# Patient Record
Sex: Female | Born: 1990 | Race: Black or African American | Hispanic: No | Marital: Single | Smoking: Never smoker
Health system: Southern US, Community
[De-identification: ages and names within clinical notes are randomized; demographics above are authoritative.]

## PROBLEM LIST (undated history)

## (undated) DIAGNOSIS — R519 Headache, unspecified: Secondary | ICD-10-CM

## (undated) DIAGNOSIS — IMO0002 Reserved for concepts with insufficient information to code with codable children: Secondary | ICD-10-CM

## (undated) DIAGNOSIS — E039 Hypothyroidism, unspecified: Secondary | ICD-10-CM

## (undated) DIAGNOSIS — R569 Unspecified convulsions: Secondary | ICD-10-CM

## (undated) DIAGNOSIS — M329 Systemic lupus erythematosus, unspecified: Secondary | ICD-10-CM

## (undated) DIAGNOSIS — G43909 Migraine, unspecified, not intractable, without status migrainosus: Secondary | ICD-10-CM

## (undated) DIAGNOSIS — R51 Headache: Secondary | ICD-10-CM

---
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
## 2012-01-31 ENCOUNTER — Encounter (HOSPITAL_BASED_OUTPATIENT_CLINIC_OR_DEPARTMENT_OTHER): Payer: Self-pay | Admitting: *Deleted

## 2012-01-31 ENCOUNTER — Emergency Department (HOSPITAL_BASED_OUTPATIENT_CLINIC_OR_DEPARTMENT_OTHER): Payer: BC Managed Care – PPO

## 2012-01-31 ENCOUNTER — Emergency Department (HOSPITAL_BASED_OUTPATIENT_CLINIC_OR_DEPARTMENT_OTHER)
Admission: EM | Admit: 2012-01-31 | Discharge: 2012-01-31 | Disposition: A | Discharge status: Home / Self Care | Payer: BC Managed Care – PPO | Attending: Emergency Medicine | Admitting: Emergency Medicine

## 2012-01-31 DIAGNOSIS — S0093XA Contusion of unspecified part of head, initial encounter: Secondary | ICD-10-CM

## 2012-01-31 DIAGNOSIS — R569 Unspecified convulsions: Secondary | ICD-10-CM

## 2012-01-31 DIAGNOSIS — R259 Unspecified abnormal involuntary movements: Secondary | ICD-10-CM | POA: Insufficient documentation

## 2012-01-31 DIAGNOSIS — D649 Anemia, unspecified: Secondary | ICD-10-CM | POA: Insufficient documentation

## 2012-01-31 HISTORY — DX: Headache, unspecified: R51.9

## 2012-01-31 HISTORY — DX: Headache: R51

## 2012-01-31 LAB — BASIC METABOLIC PANEL
BUN: 15 mg/dL (ref 6–23)
CO2: 25 mEq/L (ref 19–32)
Calcium: 9.4 mg/dL (ref 8.4–10.5)
Chloride: 105 mEq/L (ref 96–112)
Creatinine, Ser: 0.9 mg/dL (ref 0.50–1.10)
GFR calc Af Amer: >90 mL/min (ref >90)
GFR calc non Af Amer: >90 mL/min (ref >90)
Glucose, Bld: 95 mg/dL (ref 70–99)
Potassium: 3.8 mEq/L (ref 3.5–5.1)
Sodium: 139 mEq/L (ref 135–145)

## 2012-01-31 LAB — URINALYSIS, ROUTINE W REFLEX MICROSCOPIC
Bilirubin Urine: NEGATIVE
Glucose, UA: NEGATIVE mg/dL
Hgb urine dipstick: NEGATIVE
Ketones, ur: NEGATIVE mg/dL
Leukocytes, UA: NEGATIVE
Nitrite: NEGATIVE
Protein, ur: NEGATIVE mg/dL
Specific Gravity, Urine: 1.029 (ref 1.005–1.030)
Urobilinogen, UA: 1 mg/dL (ref 0.0–1.0)
pH: 6 (ref 5.0–8.0)

## 2012-01-31 LAB — CBC WITH DIFFERENTIAL/PLATELET
Basophils Absolute: 0 10*3/uL (ref 0.0–0.1)
Basophils Relative: 0 % (ref 0–1)
Eosinophils Absolute: 0.1 10*3/uL (ref 0.0–0.7)
Eosinophils Relative: 1 % (ref 0–5)
HCT: 35.8 % — ABNORMAL LOW (ref 36.0–46.0)
Hemoglobin: 11.6 g/dL — ABNORMAL LOW (ref 12.0–15.0)
Lymphocytes Relative: 34 % (ref 12–46)
Lymphs Abs: 3.1 10*3/uL (ref 0.7–4.0)
MCH: 25.8 pg — ABNORMAL LOW (ref 26.0–34.0)
MCHC: 32.4 g/dL (ref 30.0–36.0)
MCV: 79.7 fL (ref 78.0–100.0)
Monocytes Absolute: 0.8 10*3/uL (ref 0.1–1.0)
Monocytes Relative: 9 % (ref 3–12)
Neutro Abs: 5.2 10*3/uL (ref 1.7–7.7)
Neutrophils Relative %: 56 % (ref 43–77)
Platelets: 270 10*3/uL (ref 150–400)
RBC: 4.49 MIL/uL (ref 3.87–5.11)
RDW: 14.3 % (ref 11.5–15.5)
WBC: 9.2 10*3/uL (ref 4.0–10.5)

## 2012-01-31 LAB — PREGNANCY, URINE: Preg Test, Ur: NEGATIVE

## 2012-01-31 NOTE — ED Provider Notes (Signed)
Medical screening examination/treatment/procedure(s) were performed by non-physician practitioner and as supervising physician I was immediately available for consultation/collaboration.   Celene Kras, MD 01/31/12 [phone]
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
## 2012-01-31 NOTE — ED Notes (Signed)
Walked pt to [facility] sightly unsteady on feet.Alert/Orient x3,could not remember what happen,mom explained episode.

## 2012-01-31 NOTE — ED Notes (Signed)
Headaches for months. Tonight she had a syncopal episode followed by what mother states was a seizure. She was recently started on medications for the headaches.

## 2012-03-19 NOTE — ED Notes (Signed)
Pt showed up here in lobby requesting copy of school note from Sept. Pt provided note.

## 2013-10-24 ENCOUNTER — Other Ambulatory Visit (HOSPITAL_BASED_OUTPATIENT_CLINIC_OR_DEPARTMENT_OTHER): Payer: Self-pay | Admitting: Internal Medicine

## 2013-10-24 DIAGNOSIS — E039 Hypothyroidism, unspecified: Secondary | ICD-10-CM

## 2013-10-26 ENCOUNTER — Ambulatory Visit (HOSPITAL_BASED_OUTPATIENT_CLINIC_OR_DEPARTMENT_OTHER)
Admission: RE | Admit: 2013-10-26 | Discharge: 2013-10-26 | Disposition: A | Discharge status: Home / Self Care | Payer: BC Managed Care – PPO | Source: Ambulatory Visit | Attending: Internal Medicine | Admitting: Internal Medicine

## 2013-10-26 DIAGNOSIS — E049 Nontoxic goiter, unspecified: Secondary | ICD-10-CM | POA: Insufficient documentation

## 2013-10-26 DIAGNOSIS — E039 Hypothyroidism, unspecified: Secondary | ICD-10-CM

## 2013-11-29 ENCOUNTER — Encounter (HOSPITAL_COMMUNITY): Payer: Self-pay | Admitting: Emergency Medicine

## 2013-11-29 ENCOUNTER — Emergency Department (HOSPITAL_COMMUNITY)
Admission: EM | Admit: 2013-11-29 | Discharge: 2013-11-29 | Disposition: A | Discharge status: Home / Self Care | Payer: BC Managed Care – PPO | Attending: Emergency Medicine | Admitting: Emergency Medicine

## 2013-11-29 DIAGNOSIS — Z9119 Patient's noncompliance with other medical treatment and regimen: Secondary | ICD-10-CM | POA: Insufficient documentation

## 2013-11-29 DIAGNOSIS — G43909 Migraine, unspecified, not intractable, without status migrainosus: Secondary | ICD-10-CM

## 2013-11-29 DIAGNOSIS — G43009 Migraine without aura, not intractable, without status migrainosus: Secondary | ICD-10-CM | POA: Insufficient documentation

## 2013-11-29 DIAGNOSIS — Z79899 Other long term (current) drug therapy: Secondary | ICD-10-CM | POA: Insufficient documentation

## 2013-11-29 DIAGNOSIS — Z8669 Personal history of other diseases of the nervous system and sense organs: Secondary | ICD-10-CM | POA: Insufficient documentation

## 2013-11-29 DIAGNOSIS — G40909 Epilepsy, unspecified, not intractable, without status epilepticus: Secondary | ICD-10-CM | POA: Insufficient documentation

## 2013-11-29 DIAGNOSIS — E559 Vitamin D deficiency, unspecified: Secondary | ICD-10-CM | POA: Insufficient documentation

## 2013-11-29 DIAGNOSIS — Z91199 Patient's noncompliance with other medical treatment and regimen due to unspecified reason: Secondary | ICD-10-CM | POA: Insufficient documentation

## 2013-11-29 HISTORY — DX: Migraine, unspecified, not intractable, without status migrainosus: G43.909

## 2013-11-29 HISTORY — DX: Unspecified convulsions: R56.9

## 2013-11-29 LAB — BASIC METABOLIC PANEL
Anion gap: 17 — ABNORMAL HIGH (ref 5–15)
BUN: 10 mg/dL (ref 6–23)
CO2: 22 mEq/L (ref 19–32)
Calcium: 9.3 mg/dL (ref 8.4–10.5)
Chloride: 99 mEq/L (ref 96–112)
Creatinine, Ser: 0.62 mg/dL (ref 0.50–1.10)
GFR calc Af Amer: >90 mL/min (ref >90)
GFR calc non Af Amer: >90 mL/min (ref >90)
Glucose, Bld: 81 mg/dL (ref 70–99)
Potassium: 4.1 mEq/L (ref 3.7–5.3)
Sodium: 138 mEq/L (ref 137–147)

## 2013-11-29 LAB — CBC
HCT: 39.8 % (ref 36.0–46.0)
Hemoglobin: 12.7 g/dL (ref 12.0–15.0)
MCH: 23.6 pg — ABNORMAL LOW (ref 26.0–34.0)
MCHC: 31.9 g/dL (ref 30.0–36.0)
MCV: 73.8 fL — ABNORMAL LOW (ref 78.0–100.0)
Platelets: 331 10*3/uL (ref 150–400)
RBC: 5.39 MIL/uL — ABNORMAL HIGH (ref 3.87–5.11)
RDW: 16.2 % — ABNORMAL HIGH (ref 11.5–15.5)
WBC: 7.3 10*3/uL (ref 4.0–10.5)

## 2013-11-29 LAB — HCG, SERUM, QUALITATIVE: Preg, Serum: NEGATIVE

## 2013-11-29 MED ORDER — SODIUM CHLORIDE 0.9 % IV BOLUS (SEPSIS)
1000.0000 mL | Freq: Once | INTRAVENOUS | Status: AC
  Administered 2013-11-29: 1000 mL via INTRAVENOUS

## 2013-11-29 MED ORDER — METOCLOPRAMIDE HCL 5 MG/ML IJ SOLN
10.0000 mg | Freq: Once | INTRAMUSCULAR | Status: AC
  Administered 2013-11-29: 10 mg via INTRAVENOUS
  Filled 2013-11-29: qty 2

## 2013-11-29 MED ORDER — BUTALBITAL-APAP-CAFFEINE 50-325-40 MG PO TABS: 1.0000 | ORAL_TABLET | Freq: Four times a day (QID) | ORAL | Status: AC | PRN

## 2013-11-29 MED ORDER — DIPHENHYDRAMINE HCL 50 MG/ML IJ SOLN
25.0000 mg | Freq: Once | INTRAMUSCULAR | Status: AC
  Administered 2013-11-29: 25 mg via INTRAVENOUS
  Filled 2013-11-29: qty 1

## 2013-11-29 MED ORDER — DEXAMETHASONE SODIUM PHOSPHATE 10 MG/ML IJ SOLN
10.0000 mg | Freq: Once | INTRAMUSCULAR | Status: AC
  Administered 2013-11-29: 10 mg via INTRAVENOUS
  Filled 2013-11-29: qty 1

## 2013-11-29 NOTE — ED Notes (Signed)
Pt reports feeling dehydrated and feeling dizzy.  Has not been taking vit D deficiency medications for "months." Reports hx of seizures when not taking meds. Denies blurred vision; reports feeling the same as when she had seizure

## 2013-11-29 NOTE — ED Notes (Signed)
Unsuccessful IV attempt; second RN to attempt

## 2013-11-29 NOTE — Discharge Instructions (Signed)
.Please follow with your primary care doctor in the next 2 days for a check-up. They must obtain records for further management.   Do not hesitate to return to the Emergency Department for any new, worsening or concerning symptoms.   Migraine Headache A migraine headache is an intense, throbbing pain on one or both sides of your head. A migraine can last for 30 minutes to several hours. CAUSES  The exact cause of a migraine headache is not always known. However, a migraine may be caused when nerves in the brain become irritated and release chemicals that cause inflammation. This causes pain. Certain things may also trigger migraines, such as:  Alcohol.  Smoking.  Stress.  Menstruation.  Aged cheeses.  Foods or drinks that contain nitrates, glutamate, aspartame, or tyramine.  Lack of sleep.  Chocolate.  Caffeine.  Hunger.  Physical exertion.  Fatigue.  Medicines used to treat chest pain (nitroglycerine), birth control pills, estrogen, and some blood pressure medicines. SIGNS AND SYMPTOMS  Pain on one or both sides of your head.  Pulsating or throbbing pain.  Severe pain that prevents daily activities.  Pain that is aggravated by any physical activity.  Nausea, vomiting, or both.  Dizziness.  Pain with exposure to bright lights, loud noises, or activity.  General sensitivity to bright lights, loud noises, or smells. Before you get a migraine, you may get warning signs that a migraine is coming (aura). An aura may include:  Seeing flashing lights.  Seeing bright spots, halos, or zig-zag lines.  Having tunnel vision or blurred vision.  Having feelings of numbness or tingling.  Having trouble talking.  Having muscle weakness. DIAGNOSIS  A migraine headache is often diagnosed based on:  Symptoms.  Physical exam.  A CT scan or MRI of your head. These imaging tests cannot diagnose migraines, but they can help rule out other causes of  headaches. TREATMENT Medicines may be given for pain and nausea. Medicines can also be given to help prevent recurrent migraines.  HOME CARE INSTRUCTIONS  Only take over-the-counter or prescription medicines for pain or discomfort as directed by your health care provider. The use of long-term narcotics is not recommended.  Lie down in a dark, quiet room when you have a migraine.  Keep a journal to find out what may trigger your migraine headaches. For example, write down:  What you eat and drink.  How much sleep you get.  Any change to your diet or medicines.  Limit alcohol consumption.  Quit smoking if you smoke.  Get 7-9 hours of sleep, or as recommended by your health care provider.  Limit stress.  Keep lights dim if bright lights bother you and make your migraines worse. SEEK IMMEDIATE MEDICAL CARE IF:   Your migraine becomes severe.  You have a fever.  You have a stiff neck.  You have vision loss.  You have muscular weakness or loss of muscle control.  You start losing your balance or have trouble walking.  You feel faint or pass out.  You have severe symptoms that are different from your first symptoms. MAKE SURE YOU:   Understand these instructions.  Will watch your condition.  Will get help right away if you are not doing well or get worse. Document Released: 05/02/2005 Document Revised: 02/20/2013 Document Reviewed: 01/07/2013 [facility] Patient Information 2015 [place], [place]. This information is not intended to replace advice given to you by your health care provider. Make sure you discuss any questions you have with your health  care provider.  

## 2013-11-29 NOTE — ED Notes (Signed)
She states " i feel like im in a fog, it feels like the time i was dehydrated, i hardly ever drink any water." also she c/o migraine headache. She took sumatriptan at home with no relief

## 2013-11-29 NOTE — ED Provider Notes (Signed)
CSN: [id number]     Arrival date & time 11/29/13  1008 History   First MD Initiated Contact with Patient 11/29/13 1051     Chief Complaint  Patient presents with  . Fatigue     (Consider location/radiation/quality/duration/timing/severity/associated sxs/prior Treatment) HPI  Caitlin Dodson is a 23 y.o. female complaining of exacerbation of chronic migraine. Patient states that she has had a headache for 4 days. States it is 6/10, described as throbbing, it is bilateral frontal. This is typical for her headaches however normally they don't last this long. She's been taking sumatriptan at home with no relief. Patient [facility]. She also has a history of vitamin D deficiency she's been noncompliant with her vitamin D supplementation. She also has associated symptoms of forgetfulness and easily distractible the last 4 days. She denies fever, chills, cervicalgia, rash, thunderclap onset, photophobia, phonophobia, chest pain, shortness of breath, abdominal pain, nausea vomiting, change in bowel or bladder habits.  Past Medical History  Diagnosis Date  . Persistent headaches   . Migraine   . Seizures    History reviewed. No pertinent past surgical history. History reviewed. No pertinent family history. History  Substance Use Topics  . Smoking status: Never Smoker   . Smokeless tobacco: Not on file  . Alcohol Use: No   OB History   Grav Para Term Preterm Abortions TAB SAB Ect Mult Living                 Review of Systems  10 systems reviewed and found to be negative, except as noted in the HPI.   Allergies  Tomato  Home Medications   Prior to Admission medications   Medication Sig Start Date End Date Taking? Authorizing Provider  aspirin-acetaminophen-caffeine (EXCEDRIN MIGRAINE) [phone] MG per tablet Take 1 tablet by mouth every 6 (six) hours as needed. For headache.   Yes Historical Provider, MD  SUMAtriptan (IMITREX) 50 MG tablet Take 50 mg by mouth every 2  (two) hours as needed. For migraine.   Yes Historical Provider, MD  Vitamin D, Ergocalciferol, (DRISDOL) 50000 UNITS CAPS capsule Take 50,000 Units by mouth every 7 (seven) days.   Yes Historical Provider, MD  butalbital-acetaminophen-caffeine (FIORICET) 50-325-40 MG per tablet Take 1 tablet by mouth every 6 (six) hours as needed for headache. 11/29/13 11/29/14  Joni Reining Enos Muhl, PA-C   BP 108/68  Pulse 69  Temp(Src) 98.7 F (37.1 C) (Oral)  Resp 18  Ht 5\' 6"  (1.676 m)  Wt 230 lb (104.327 kg)  BMI 37.14 kg/m2  SpO2 100% Physical Exam  Nursing note and vitals reviewed. Constitutional: She is oriented to person, place, and time. She appears well-developed and well-nourished. No distress.  HENT:  Head: Normocephalic and atraumatic.  Mouth/Throat: Oropharynx is clear and moist.  Eyes: Conjunctivae and EOM are normal. Pupils are equal, round, and reactive to light.  Neck: Normal range of motion. Neck supple.  FROM to C-spine. Pt can touch chin to chest without discomfort. No TTP of midline cervical spine.   Cardiovascular: Normal rate, regular rhythm and intact distal pulses.   Pulmonary/Chest: Effort normal and breath sounds normal. No stridor. No respiratory distress. She has no wheezes. She has no rales. She exhibits no tenderness.  Abdominal: Soft. Bowel sounds are normal. She exhibits no distension and no mass. There is no tenderness. There is no rebound and no guarding.  Musculoskeletal: Normal range of motion.  Neurological: She is alert and oriented to person, place, and time. No cranial nerve  deficit.  Follows commands, Clear, goal oriented speech, Strength is 5 out of 5x4 extremities, patient ambulates with a coordinated in nonantalgic gait. Sensation is grossly intact.   Psychiatric: She has a normal mood and affect.    ED Course  Procedures (including critical care time) Labs Review Labs Reviewed  CBC - Abnormal; Notable for the following:    RBC 5.39 (*)    MCV 73.8 (*)     MCH 23.6 (*)    RDW 16.2 (*)    All other components within normal limits  BASIC METABOLIC PANEL - Abnormal; Notable for the following:    Anion gap 17 (*)    All other components within normal limits  HCG, SERUM, QUALITATIVE    Imaging Review No results found.   EKG Interpretation None      MDM   Final diagnoses:  Migraine without status migrainosus, not intractable, unspecified migraine type    Filed Vitals:   11/29/13 1028 11/29/13 1215 11/29/13 1300  BP: 129/80 116/72 108/68  Pulse: 91 90 69  Temp: 98.7 F (37.1 C)    TempSrc: Oral    Resp: 18 18 18   Height: 5\' 6"  (1.676 m)    Weight: 230 lb (104.327 kg)    SpO2: 97% 100% 100%    Medications  sodium chloride 0.9 % bolus 1,000 mL (0 mLs Intravenous Stopped 11/29/13 1311)  metoCLOPramide (REGLAN) injection 10 mg (10 mg Intravenous Given 11/29/13 1209)  diphenhydrAMINE (BENADRYL) injection 25 mg (25 mg Intravenous Given 11/29/13 1209)  dexamethasone (DECADRON) injection 10 mg (10 mg Intravenous Given 11/29/13 1209)    Caitlin Dodson is a 23 y.o. female presenting with exacerbation of chronic migraine, not alleviated by sumatriptan. Neuro exam is nonfocal. Headache presents typical for her, however, it has lasted  longer than normal. HA. Presentation is like pts typical HA and non concerning for [facility], ICH, Meningitis, or temporal arteritis. Pt is afebrile with no focal neuro deficits, nuchal rigidity, or change in vision. Pt is to follow up with her neurologist. Pt verbalizes understanding and is agreeable with plan to dc.  Evaluation does not show pathology that would require ongoing emergent intervention or inpatient treatment. Pt is hemodynamically stable and mentating appropriately. Discussed findings and plan with patient/guardian, who agrees with care plan. All questions answered. Return precautions discussed and outpatient follow up given.   Discharge Medication List as of 11/29/2013 12:58 PM    START taking  these medications   Details  butalbital-acetaminophen-caffeine (FIORICET) 50-325-40 MG per tablet Take 1 tablet by mouth every 6 (six) hours as needed for headache., Starting 11/29/2013, Until Sat 11/29/14, [employer]             Veralyn Lopp, PA-C 11/29/13 1652

## 2013-12-02 NOTE — ED Provider Notes (Signed)
Medical screening examination/treatment/procedure(s) were performed by non-physician practitioner and as supervising physician I was immediately available for consultation/collaboration.   EKG Interpretation None        Richardean Canalavid H Abeeha Twist, MD 12/02/13 [phone]

## 2013-12-18 ENCOUNTER — Emergency Department (HOSPITAL_COMMUNITY)
Admission: EM | Admit: 2013-12-18 | Discharge: 2013-12-18 | Disposition: A | Discharge status: Home / Self Care | Payer: BC Managed Care – PPO | Attending: Emergency Medicine | Admitting: Emergency Medicine

## 2013-12-18 ENCOUNTER — Encounter (HOSPITAL_COMMUNITY): Payer: Self-pay | Admitting: Emergency Medicine

## 2013-12-18 DIAGNOSIS — R221 Localized swelling, mass and lump, neck: Secondary | ICD-10-CM

## 2013-12-18 DIAGNOSIS — R220 Localized swelling, mass and lump, head: Secondary | ICD-10-CM | POA: Diagnosis not present

## 2013-12-18 DIAGNOSIS — I10 Essential (primary) hypertension: Secondary | ICD-10-CM | POA: Insufficient documentation

## 2013-12-18 DIAGNOSIS — Z79899 Other long term (current) drug therapy: Secondary | ICD-10-CM | POA: Diagnosis not present

## 2013-12-18 DIAGNOSIS — S0993XA Unspecified injury of face, initial encounter: Secondary | ICD-10-CM | POA: Insufficient documentation

## 2013-12-18 DIAGNOSIS — Y9389 Activity, other specified: Secondary | ICD-10-CM | POA: Diagnosis not present

## 2013-12-18 DIAGNOSIS — T783XXA Angioneurotic edema, initial encounter: Secondary | ICD-10-CM

## 2013-12-18 DIAGNOSIS — S199XXA Unspecified injury of neck, initial encounter: Secondary | ICD-10-CM

## 2013-12-18 DIAGNOSIS — Y92410 Unspecified street and highway as the place of occurrence of the external cause: Secondary | ICD-10-CM | POA: Diagnosis not present

## 2013-12-18 DIAGNOSIS — G43909 Migraine, unspecified, not intractable, without status migrainosus: Secondary | ICD-10-CM | POA: Insufficient documentation

## 2013-12-18 MED ORDER — PREDNISONE 20 MG PO TABS: ORAL_TABLET | ORAL | Status: DC

## 2013-12-18 MED ORDER — FAMOTIDINE 20 MG PO TABS: 20.0000 mg | ORAL_TABLET | Freq: Two times a day (BID) | ORAL | Status: DC

## 2013-12-18 MED ORDER — PREDNISONE 20 MG PO TABS
60.0000 mg | ORAL_TABLET | Freq: Once | ORAL | Status: AC
  Administered 2013-12-18: 60 mg via ORAL
  Filled 2013-12-18: qty 3

## 2013-12-18 MED ORDER — FAMOTIDINE 20 MG PO TABS
20.0000 mg | ORAL_TABLET | Freq: Once | ORAL | Status: AC
  Administered 2013-12-18: 20 mg via ORAL
  Filled 2013-12-18: qty 1

## 2013-12-18 MED ORDER — DIPHENHYDRAMINE HCL 25 MG PO CAPS
50.0000 mg | ORAL_CAPSULE | Freq: Once | ORAL | Status: DC
  Filled 2013-12-18: qty 2

## 2013-12-18 MED ORDER — DIPHENHYDRAMINE HCL 25 MG PO TABS: 25.0000 mg | ORAL_TABLET | Freq: Four times a day (QID) | ORAL | Status: DC
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
## 2013-12-18 NOTE — ED Provider Notes (Signed)
Medical screening examination/treatment/procedure(s) were performed by non-physician practitioner and as supervising physician I was immediately available for consultation/collaboration.   EKG Interpretation None        Audree CamelScott T Kush Farabee, MD 12/18/13 2252
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
## 2015-04-15 ENCOUNTER — Encounter (HOSPITAL_BASED_OUTPATIENT_CLINIC_OR_DEPARTMENT_OTHER): Payer: Self-pay | Admitting: *Deleted

## 2015-04-15 ENCOUNTER — Emergency Department (HOSPITAL_BASED_OUTPATIENT_CLINIC_OR_DEPARTMENT_OTHER)
Admission: EM | Admit: 2015-04-15 | Discharge: 2015-04-16 | Disposition: A | Discharge status: Home / Self Care | Payer: BLUE CROSS/BLUE SHIELD | Attending: Emergency Medicine | Admitting: Emergency Medicine

## 2015-04-15 DIAGNOSIS — Z8639 Personal history of other endocrine, nutritional and metabolic disease: Secondary | ICD-10-CM | POA: Diagnosis not present

## 2015-04-15 DIAGNOSIS — Z8679 Personal history of other diseases of the circulatory system: Secondary | ICD-10-CM | POA: Insufficient documentation

## 2015-04-15 DIAGNOSIS — R112 Nausea with vomiting, unspecified: Secondary | ICD-10-CM | POA: Diagnosis present

## 2015-04-15 DIAGNOSIS — Z3202 Encounter for pregnancy test, result negative: Secondary | ICD-10-CM | POA: Insufficient documentation

## 2015-04-15 DIAGNOSIS — Z8739 Personal history of other diseases of the musculoskeletal system and connective tissue: Secondary | ICD-10-CM | POA: Diagnosis not present

## 2015-04-15 HISTORY — DX: Hypothyroidism, unspecified: E03.9

## 2015-04-15 HISTORY — DX: Systemic lupus erythematosus, unspecified: M32.9

## 2015-04-15 HISTORY — DX: Reserved for concepts with insufficient information to code with codable children: IMO0002

## 2015-04-15 MED ORDER — SODIUM CHLORIDE 0.9 % IV BOLUS (SEPSIS)
1000.0000 mL | Freq: Once | INTRAVENOUS | Status: AC
  Administered 2015-04-15: 1000 mL via INTRAVENOUS

## 2015-04-15 MED ORDER — DIAZEPAM 5 MG/ML IJ SOLN
5.0000 mg | Freq: Once | INTRAMUSCULAR | Status: AC
  Administered 2015-04-15: 5 mg via INTRAVENOUS
  Filled 2015-04-15: qty 2

## 2015-04-15 MED ORDER — ONDANSETRON HCL 4 MG/2ML IJ SOLN
4.0000 mg | Freq: Once | INTRAMUSCULAR | Status: AC
  Administered 2015-04-15: 4 mg via INTRAVENOUS
  Filled 2015-04-15: qty 2

## 2015-04-15 NOTE — ED Notes (Signed)
Vomiting x 2 days °

## 2015-04-15 NOTE — ED Notes (Signed)
MD at bedside. 

## 2015-04-15 NOTE — ED Provider Notes (Signed)
CSN: [id number]     Arrival date & time 04/15/15  2213 History  By signing my name below, I, Soijett Blue, attest that this documentation has been prepared under the direction and in the presence of Paula Libra, MD. Electronically Signed: Soijett Blue, ED Scribe. 04/15/2015. 10:53 PM.   Chief Complaint  Patient presents with  . Vomiting       The history is provided by the patient. No language interpreter was used.    HPI Comments: SUBRENA DEVEREUX is a 24 y.o. female with a medical hx of seizures, lupus, and hypothyroid, who presents to the Emergency Department complaining of vomiting x 4 episodes onset yesterday. She states that she is having associated symptoms of nausea and dizziness. She notes that her dizziness is a head spinning sensation and that the symptoms began at the same time as her vomiting. She states that she has not tried any medications for the relief for her symptoms. She denies diarrhea, abdominal pain/cramping, fever, SOB, and any other symptoms. Patient's last menstrual period was 04/15/2015.    Past Medical History  Diagnosis Date  . Persistent headaches   . Migraine   . Seizures (HCC)   . Lupus (HCC)   . Hypothyroid    History reviewed. No pertinent past surgical history. History reviewed. No pertinent family history. Social History  Substance Use Topics  . Smoking status: Never Smoker   . Smokeless tobacco: None  . Alcohol Use: No   OB History    No data available     Review of Systems   A complete 10 system review of systems was obtained and all systems are negative except as noted in the HPI and PMH.    Allergies  Tomato  Home Medications   Prior to Admission medications   Not on File   BP 122/77 mmHg  Pulse 72  Temp(Src) 98.4 F (36.9 C) (Oral)  Resp 18  Ht  (1.676 m)  Wt 230 lb (104.327 kg)  BMI 37.14 kg/m2  SpO2 96%  LMP 04/15/2015   Physical Exam General: Well-developed, well-nourished female in no acute distress;  appearance consistent with age of record HENT: normocephalic; atraumatic Eyes: pupils equal, round and reactive to light; extraocular muscles intact. No nystagmus.  Neck: supple Heart: regular rate and rhythm Lungs: clear to auscultation bilaterally Abdomen: soft; nondistended; nontender; no masses or hepatosplenomegaly; bowel sounds present Extremities: No deformity; full range of motion; pulses normal Neurologic: Awake, alert and oriented; motor function intact in all extremities and symmetric; no facial droop Skin: Warm and dry Psychiatric: Normal mood and affect    ED Course  Procedures (including critical care time) DIAGNOSTIC STUDIES: Oxygen Saturation is 96% on RA, nl by my interpretation.    COORDINATION OF CARE: 10:53 PM Discussed treatment plan with pt at bedside which includes IV fluids and UA and pt agreed to plan.    MDM   Nursing notes and vitals signs, including pulse oximetry, reviewed.  Summary of this visit's results, reviewed by myself:  Labs:  Results for orders placed or performed during the hospital encounter of 04/15/15 (from the past 24 hour(s))  Urinalysis, Routine w reflex microscopic (not at [facility])     Status: Abnormal   Collection Time: 04/16/15 12:14 AM  Result Value Ref Range   Color, Urine YELLOW YELLOW   APPearance CLOUDY (A) CLEAR   Specific Gravity, Urine 1.009 1.005 - 1.030   pH 5.5 5.0 - 8.0   Glucose, UA NEGATIVE NEGATIVE mg/dL  Hgb urine dipstick LARGE (A) NEGATIVE   Bilirubin Urine NEGATIVE NEGATIVE   Ketones, ur 15 (A) NEGATIVE mg/dL   Protein, ur NEGATIVE NEGATIVE mg/dL   Nitrite NEGATIVE NEGATIVE   Leukocytes, UA TRACE (A) NEGATIVE  Pregnancy, urine     Status: None   Collection Time: 04/16/15 12:14 AM  Result Value Ref Range   Preg Test, Ur NEGATIVE NEGATIVE  Urine microscopic-add on     Status: Abnormal   Collection Time: 04/16/15 12:14 AM  Result Value Ref Range   Squamous Epithelial / LPF 0-5 (A) NONE SEEN   WBC, UA  0-5 0 - 5 WBC/hpf   RBC / HPF TOO NUMEROUS TO COUNT 0 - 5 RBC/hpf   Bacteria, UA RARE (A) NONE SEEN   1:28 AM Patient taking fluids without emesis. States she is ready to go.  I personally performed the services described in this documentation, which was scribed in my presence. The recorded information has been reviewed and is accurate.   Paula LibraJohn Meygan Kyser, MD 04/16/15 [phone]

## 2015-04-16 LAB — URINALYSIS, ROUTINE W REFLEX MICROSCOPIC
Bilirubin Urine: NEGATIVE
Glucose, UA: NEGATIVE mg/dL
Ketones, ur: 15 mg/dL — AB
Nitrite: NEGATIVE
Protein, ur: NEGATIVE mg/dL
Specific Gravity, Urine: 1.009 (ref 1.005–1.030)
pH: 5.5 (ref 5.0–8.0)

## 2015-04-16 LAB — PREGNANCY, URINE: Preg Test, Ur: NEGATIVE

## 2015-04-16 LAB — URINE MICROSCOPIC-ADD ON

## 2015-04-16 MED ORDER — ONDANSETRON 8 MG PO TBDP: 8.0000 mg | ORAL_TABLET | Freq: Three times a day (TID) | ORAL | Status: AC | PRN

## 2015-04-16 MED ORDER — DIAZEPAM 5 MG PO TABS: 5.0000 mg | ORAL_TABLET | Freq: Three times a day (TID) | ORAL | Status: AC | PRN

## 2015-04-16 NOTE — Discharge Instructions (Signed)
Nausea, Adult °Nausea is the feeling that you have an upset stomach or have to vomit. Nausea by itself is not likely a serious concern, but it may be an early sign of more serious medical problems. As nausea gets worse, it can lead to vomiting. If vomiting develops, there is the risk of dehydration.  °CAUSES  °· Viral infections. °· Food poisoning. °· Medicines. °· Pregnancy. °· Motion sickness. °· Migraine headaches. °· Emotional distress. °· Severe pain from any source. °· Alcohol intoxication. °HOME CARE INSTRUCTIONS °· Get plenty of rest. °· Ask your caregiver about specific rehydration instructions. °· Eat small amounts of food and sip liquids more often. °· Take all medicines as told by your caregiver. °SEEK MEDICAL CARE IF: °· You have not improved after 2 days, or you get worse. °· You have a headache. °SEEK IMMEDIATE MEDICAL CARE IF:  °· You have a fever. °· You faint. °· You keep vomiting or have blood in your vomit. °· You are extremely weak or dehydrated. °· You have dark or bloody stools. °· You have severe chest or abdominal pain. °MAKE SURE YOU: °· Understand these instructions. °· Will watch your condition. °· Will get help right away if you are not doing well or get worse. °  °This information is not intended to replace advice given to you by your health care provider. Make sure you discuss any questions you have with your health care provider. °  °Document Released: 06/09/2004 Document Revised: 05/23/2014 Document Reviewed: 01/12/2011 °Elsevier Interactive Patient Education ©2016 [employer] ° °

## 2015-04-16 NOTE — ED Notes (Signed)
Pt fluid challenged.

## 2015-04-16 NOTE — ED Notes (Signed)
Ambulated to the bathroom for urine sample.

## 2015-05-21 IMAGING — US US SOFT TISSUE HEAD/NECK
1 series · 14 of 25 positions shown · non-contrast
Comparison: None.

CLINICAL DATA: Hypothyroid.  Enlarged thyroid

EXAM:
THYROID ULTRASOUND
TECHNIQUE: Ultrasound examination of the thyroid gland and adjacent soft
tissues was performed.

[Series 1: us soft tissue head/neck · 0.08mm/px · 14 of 40 slices shown]
[im 1/40]
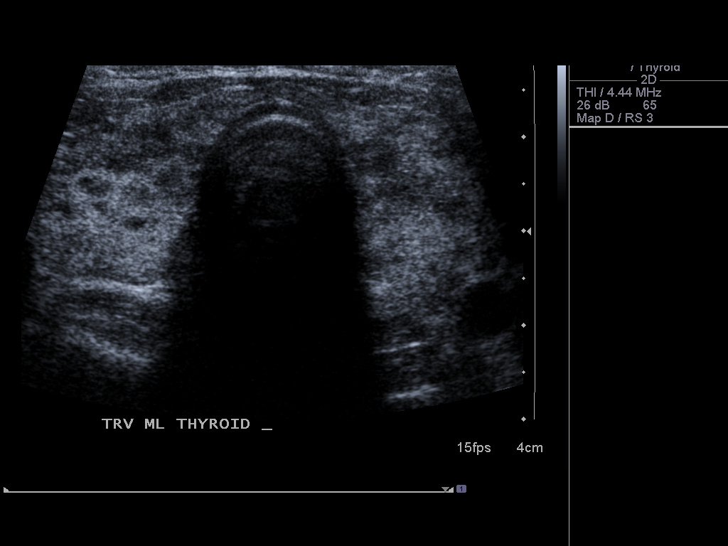
[im 4/40]
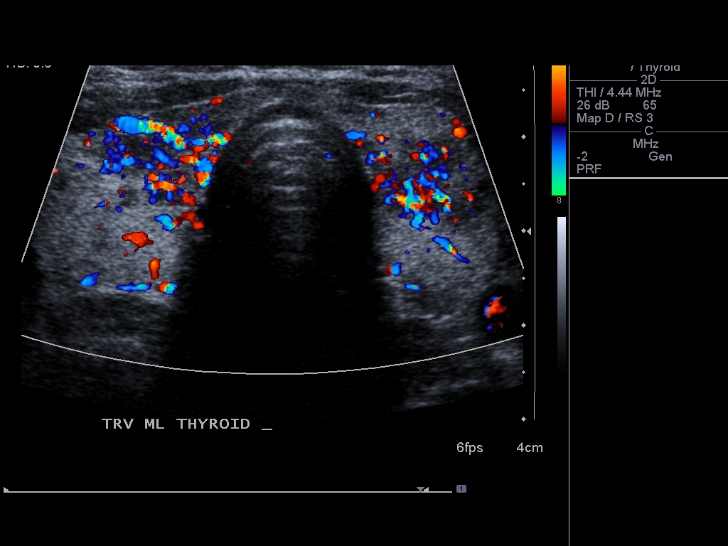
[im 7/40]
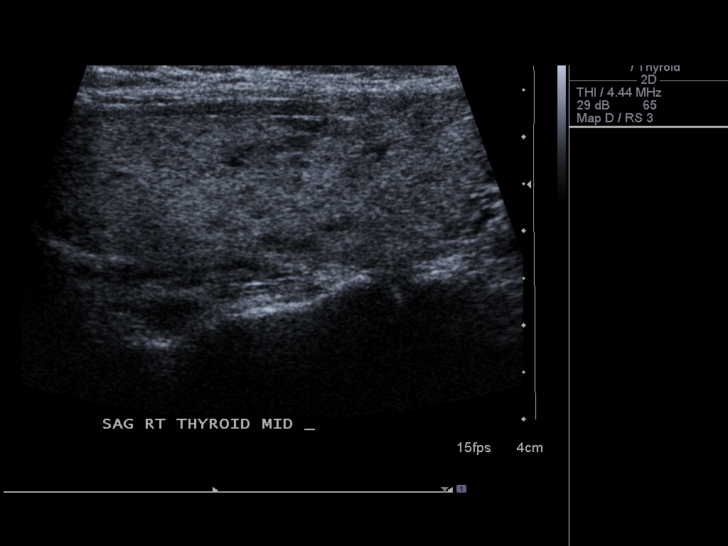
[im 10/40]
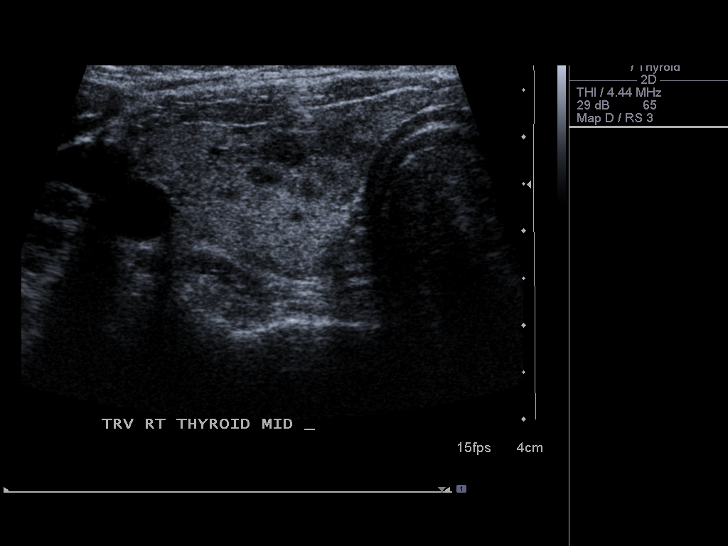
[im 14/40]
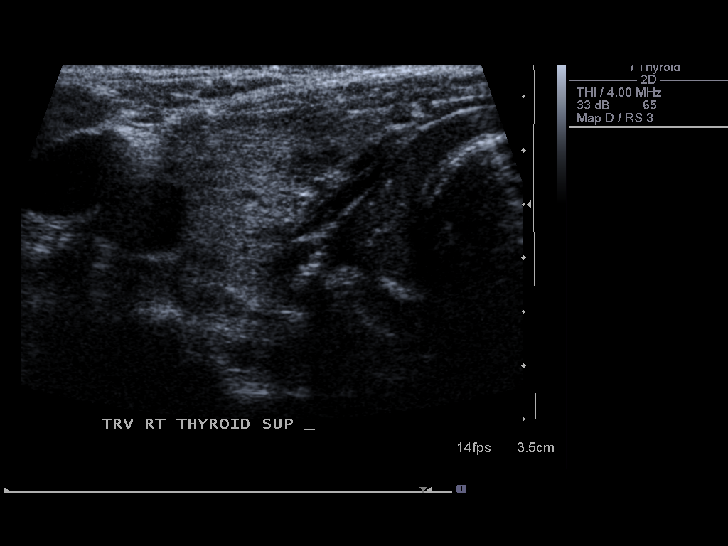
[im 15/40]
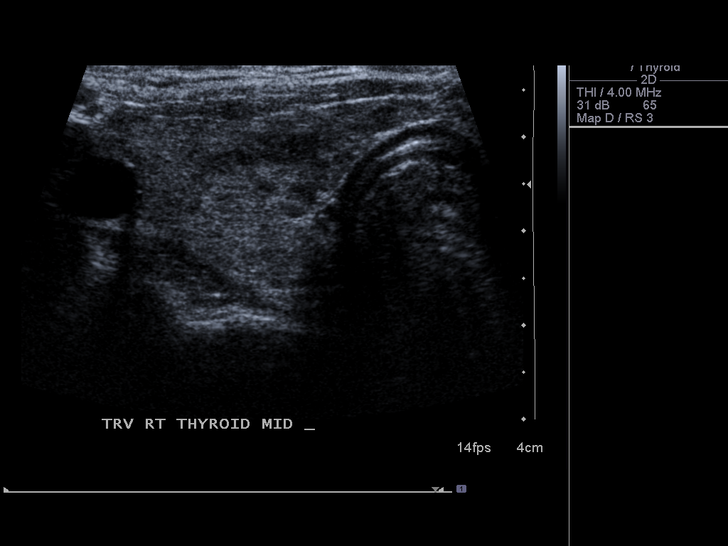
[im 18/40]
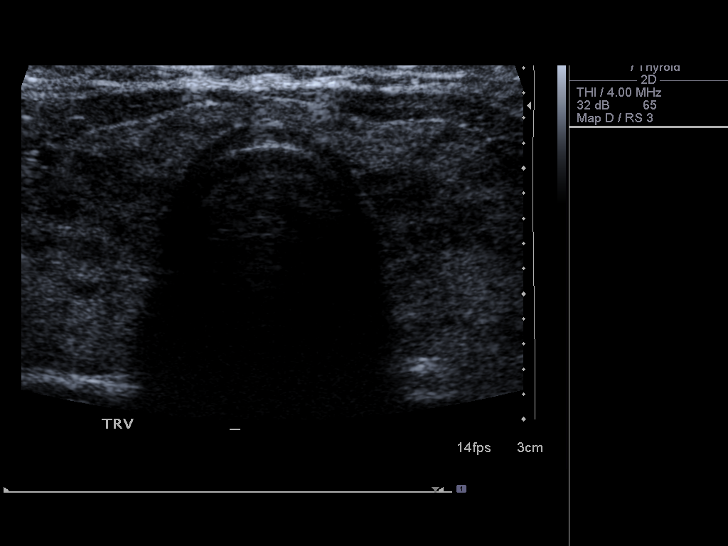
[im 22/40]
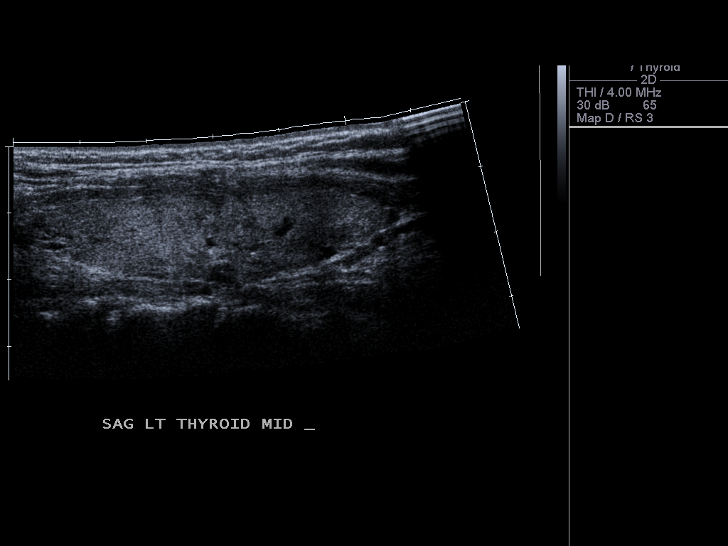
[im 25/40]
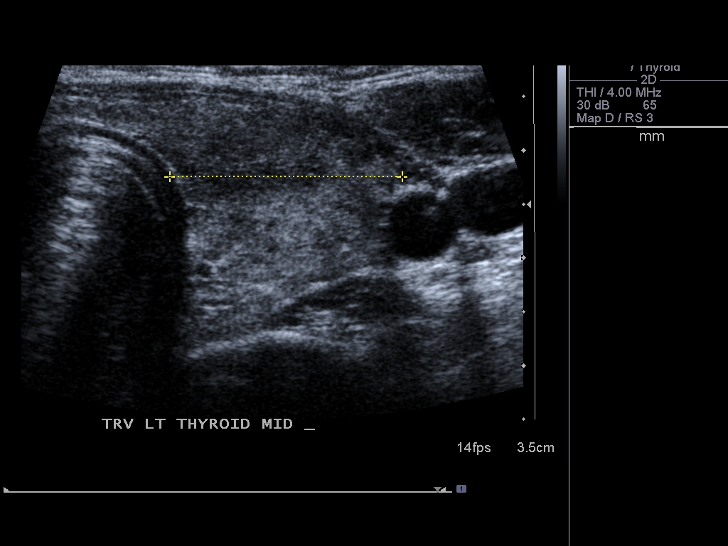
[im 27/40]
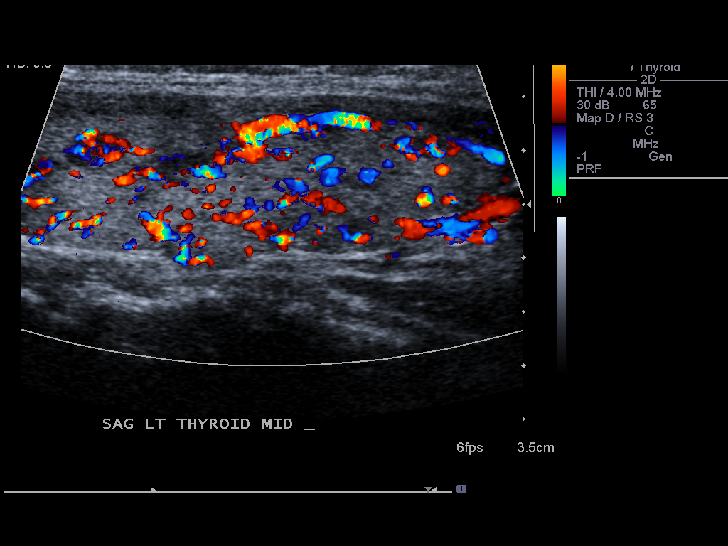
[im 30/40]
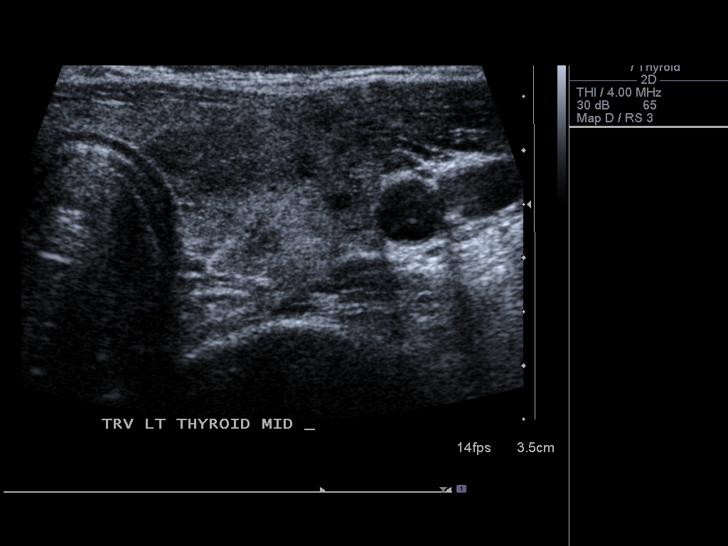
[im 33/40]
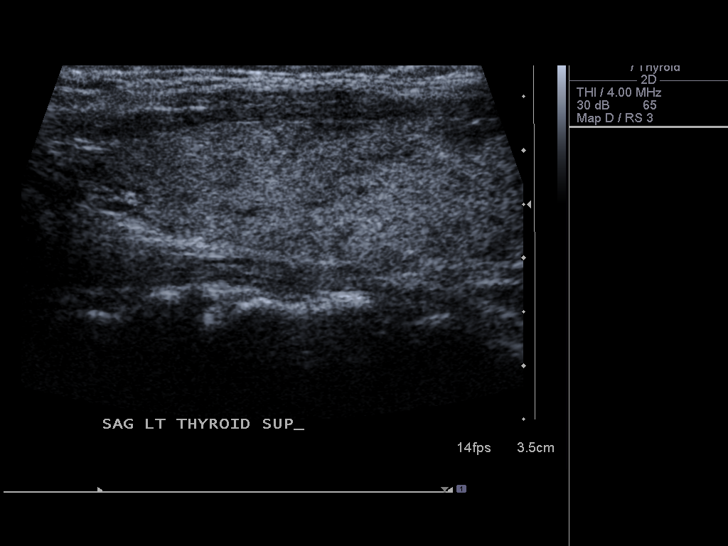
[im 36/40]
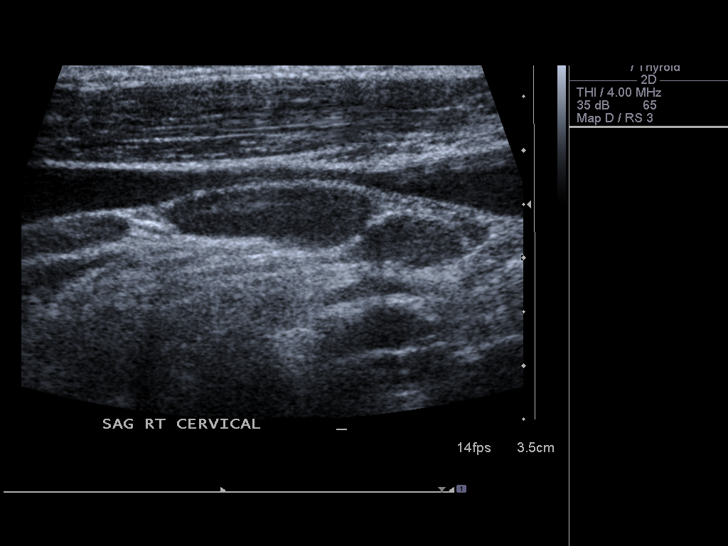
[im 40/40]
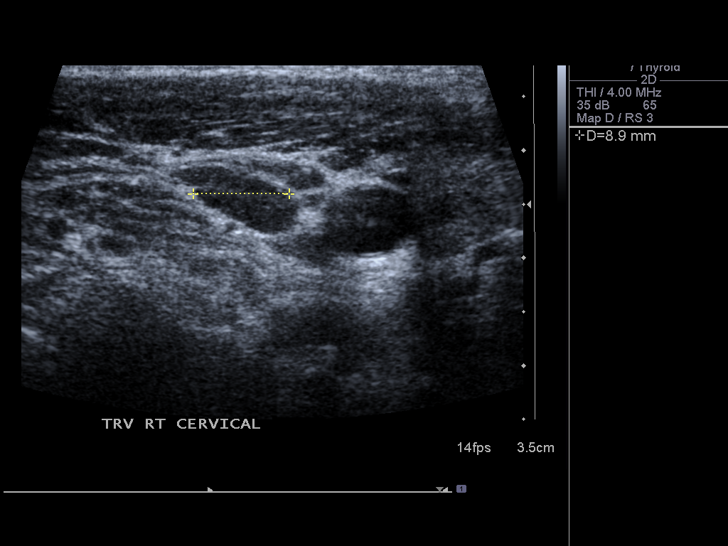

[14 of 25 positions shown; findings below may reference images not displayed]

FINDINGS: Right thyroid lobe

Measurements: 5.8 x 1.9 x 2.5 cm.. Diffusely heterogeneous. No focal
lesions identified.

Left thyroid lobe

Measurements: 5.8 x 1.4 x 2.2 cm.. Diffusely heterogeneous in
echotexture. No focal nodule or mass.

Isthmus

Thickness: 0.3 cm.  No nodules visualized.

Lymphadenopathy

None visualized.
IMPRESSION: 1. Heterogeneous and slightly enlarged thyroid gland without focal
nodule or mass.

## 2015-08-23 DIAGNOSIS — J0140 Acute pansinusitis, unspecified: Secondary | ICD-10-CM | POA: Diagnosis not present

## 2015-08-23 DIAGNOSIS — R05 Cough: Secondary | ICD-10-CM | POA: Diagnosis not present

## 2015-10-29 DIAGNOSIS — R404 Transient alteration of awareness: Secondary | ICD-10-CM | POA: Diagnosis not present

## 2015-10-29 DIAGNOSIS — R55 Syncope and collapse: Secondary | ICD-10-CM | POA: Diagnosis not present

## 2015-12-25 DIAGNOSIS — Z975 Presence of (intrauterine) contraceptive device: Secondary | ICD-10-CM | POA: Diagnosis not present

## 2015-12-25 DIAGNOSIS — F17210 Nicotine dependence, cigarettes, uncomplicated: Secondary | ICD-10-CM | POA: Diagnosis not present

## 2015-12-25 DIAGNOSIS — S0083XA Contusion of other part of head, initial encounter: Secondary | ICD-10-CM | POA: Diagnosis not present

## 2016-01-30 DIAGNOSIS — R05 Cough: Secondary | ICD-10-CM | POA: Diagnosis not present

## 2016-01-30 DIAGNOSIS — Z23 Encounter for immunization: Secondary | ICD-10-CM | POA: Diagnosis not present

## 2016-01-30 DIAGNOSIS — J018 Other acute sinusitis: Secondary | ICD-10-CM | POA: Diagnosis not present

## 2016-01-30 DIAGNOSIS — H66001 Acute suppurative otitis media without spontaneous rupture of ear drum, right ear: Secondary | ICD-10-CM | POA: Diagnosis not present

## 2016-02-18 DIAGNOSIS — F17200 Nicotine dependence, unspecified, uncomplicated: Secondary | ICD-10-CM | POA: Diagnosis not present

## 2016-02-18 DIAGNOSIS — G40909 Epilepsy, unspecified, not intractable, without status epilepticus: Secondary | ICD-10-CM | POA: Diagnosis not present

## 2016-02-18 DIAGNOSIS — Z975 Presence of (intrauterine) contraceptive device: Secondary | ICD-10-CM | POA: Diagnosis not present

## 2016-02-18 DIAGNOSIS — Z79899 Other long term (current) drug therapy: Secondary | ICD-10-CM | POA: Diagnosis not present

## 2016-02-18 DIAGNOSIS — J3489 Other specified disorders of nose and nasal sinuses: Secondary | ICD-10-CM | POA: Diagnosis not present

## 2016-02-18 DIAGNOSIS — R0981 Nasal congestion: Secondary | ICD-10-CM | POA: Diagnosis not present

## 2016-02-18 DIAGNOSIS — J069 Acute upper respiratory infection, unspecified: Secondary | ICD-10-CM | POA: Diagnosis not present

## 2016-02-18 DIAGNOSIS — B309 Viral conjunctivitis, unspecified: Secondary | ICD-10-CM | POA: Diagnosis not present

## 2016-02-18 DIAGNOSIS — H1032 Unspecified acute conjunctivitis, left eye: Secondary | ICD-10-CM | POA: Diagnosis not present

## 2016-03-07 DIAGNOSIS — E039 Hypothyroidism, unspecified: Secondary | ICD-10-CM | POA: Diagnosis not present

## 2016-03-07 DIAGNOSIS — E063 Autoimmune thyroiditis: Secondary | ICD-10-CM | POA: Diagnosis not present

## 2016-03-07 DIAGNOSIS — N913 Primary oligomenorrhea: Secondary | ICD-10-CM | POA: Diagnosis not present

## 2016-03-07 DIAGNOSIS — E0590 Thyrotoxicosis, unspecified without thyrotoxic crisis or storm: Secondary | ICD-10-CM | POA: Diagnosis not present

## 2016-06-04 DIAGNOSIS — T148XXA Other injury of unspecified body region, initial encounter: Secondary | ICD-10-CM | POA: Diagnosis not present

## 2016-06-04 DIAGNOSIS — Z791 Long term (current) use of non-steroidal anti-inflammatories (NSAID): Secondary | ICD-10-CM | POA: Diagnosis not present

## 2016-06-04 DIAGNOSIS — M542 Cervicalgia: Secondary | ICD-10-CM | POA: Diagnosis not present

## 2016-06-04 DIAGNOSIS — Z87891 Personal history of nicotine dependence: Secondary | ICD-10-CM | POA: Diagnosis not present

## 2016-06-04 DIAGNOSIS — R55 Syncope and collapse: Secondary | ICD-10-CM | POA: Diagnosis not present

## 2016-06-04 DIAGNOSIS — Z975 Presence of (intrauterine) contraceptive device: Secondary | ICD-10-CM | POA: Diagnosis not present

## 2016-06-04 DIAGNOSIS — R569 Unspecified convulsions: Secondary | ICD-10-CM | POA: Diagnosis not present

## 2016-09-21 DIAGNOSIS — L988 Other specified disorders of the skin and subcutaneous tissue: Secondary | ICD-10-CM | POA: Diagnosis not present

## 2016-09-21 DIAGNOSIS — Z23 Encounter for immunization: Secondary | ICD-10-CM | POA: Diagnosis not present

## 2017-05-19 DIAGNOSIS — R569 Unspecified convulsions: Secondary | ICD-10-CM | POA: Diagnosis not present

## 2017-05-19 DIAGNOSIS — M329 Systemic lupus erythematosus, unspecified: Secondary | ICD-10-CM | POA: Diagnosis not present

## 2017-05-19 DIAGNOSIS — J111 Influenza due to unidentified influenza virus with other respiratory manifestations: Secondary | ICD-10-CM | POA: Diagnosis not present

## 2017-05-19 DIAGNOSIS — Z975 Presence of (intrauterine) contraceptive device: Secondary | ICD-10-CM | POA: Diagnosis not present

## 2017-05-19 DIAGNOSIS — R05 Cough: Secondary | ICD-10-CM | POA: Diagnosis not present

## 2017-05-19 DIAGNOSIS — Z87891 Personal history of nicotine dependence: Secondary | ICD-10-CM | POA: Diagnosis not present

## 2017-06-09 DIAGNOSIS — Z0000 Encounter for general adult medical examination without abnormal findings: Secondary | ICD-10-CM | POA: Diagnosis not present

## 2017-06-09 DIAGNOSIS — Z5181 Encounter for therapeutic drug level monitoring: Secondary | ICD-10-CM | POA: Diagnosis not present

## 2017-06-09 DIAGNOSIS — Z01118 Encounter for examination of ears and hearing with other abnormal findings: Secondary | ICD-10-CM | POA: Diagnosis not present

## 2017-06-09 DIAGNOSIS — R7303 Prediabetes: Secondary | ICD-10-CM | POA: Diagnosis not present

## 2017-06-09 DIAGNOSIS — Z113 Encounter for screening for infections with a predominantly sexual mode of transmission: Secondary | ICD-10-CM | POA: Diagnosis not present

## 2017-06-09 DIAGNOSIS — E0590 Thyrotoxicosis, unspecified without thyrotoxic crisis or storm: Secondary | ICD-10-CM | POA: Diagnosis not present

## 2017-06-09 DIAGNOSIS — Z136 Encounter for screening for cardiovascular disorders: Secondary | ICD-10-CM | POA: Diagnosis not present

## 2017-06-09 DIAGNOSIS — E559 Vitamin D deficiency, unspecified: Secondary | ICD-10-CM | POA: Diagnosis not present

## 2017-06-09 DIAGNOSIS — Z131 Encounter for screening for diabetes mellitus: Secondary | ICD-10-CM | POA: Diagnosis not present

## 2017-09-17 DIAGNOSIS — N898 Other specified noninflammatory disorders of vagina: Secondary | ICD-10-CM | POA: Diagnosis not present

## 2017-09-17 DIAGNOSIS — N390 Urinary tract infection, site not specified: Secondary | ICD-10-CM | POA: Diagnosis not present

## 2017-11-14 DIAGNOSIS — M542 Cervicalgia: Secondary | ICD-10-CM | POA: Diagnosis not present

## 2017-11-14 DIAGNOSIS — M62838 Other muscle spasm: Secondary | ICD-10-CM | POA: Diagnosis not present

## 2017-11-14 DIAGNOSIS — Z6839 Body mass index (BMI) 39.0-39.9, adult: Secondary | ICD-10-CM | POA: Diagnosis not present

## 2017-11-14 DIAGNOSIS — R51 Headache: Secondary | ICD-10-CM | POA: Diagnosis not present

## 2018-01-11 DIAGNOSIS — K5900 Constipation, unspecified: Secondary | ICD-10-CM | POA: Diagnosis not present

## 2018-01-11 DIAGNOSIS — R5383 Other fatigue: Secondary | ICD-10-CM | POA: Diagnosis not present

## 2018-01-11 DIAGNOSIS — N390 Urinary tract infection, site not specified: Secondary | ICD-10-CM | POA: Diagnosis not present

## 2018-01-11 DIAGNOSIS — Z3202 Encounter for pregnancy test, result negative: Secondary | ICD-10-CM | POA: Diagnosis not present

## 2018-01-11 DIAGNOSIS — R102 Pelvic and perineal pain: Secondary | ICD-10-CM | POA: Diagnosis not present

## 2018-01-11 DIAGNOSIS — M329 Systemic lupus erythematosus, unspecified: Secondary | ICD-10-CM | POA: Diagnosis not present

## 2018-01-11 DIAGNOSIS — Z6841 Body Mass Index (BMI) 40.0 and over, adult: Secondary | ICD-10-CM | POA: Diagnosis not present

## 2018-01-19 DIAGNOSIS — E0590 Thyrotoxicosis, unspecified without thyrotoxic crisis or storm: Secondary | ICD-10-CM | POA: Diagnosis not present

## 2018-01-19 DIAGNOSIS — E785 Hyperlipidemia, unspecified: Secondary | ICD-10-CM | POA: Diagnosis not present

## 2018-01-19 DIAGNOSIS — N12 Tubulo-interstitial nephritis, not specified as acute or chronic: Secondary | ICD-10-CM | POA: Diagnosis not present

## 2018-01-19 DIAGNOSIS — E559 Vitamin D deficiency, unspecified: Secondary | ICD-10-CM | POA: Diagnosis not present

## 2018-01-19 DIAGNOSIS — J309 Allergic rhinitis, unspecified: Secondary | ICD-10-CM | POA: Diagnosis not present

## 2018-01-19 DIAGNOSIS — R7303 Prediabetes: Secondary | ICD-10-CM | POA: Diagnosis not present

## 2018-01-24 DIAGNOSIS — S61012A Laceration without foreign body of left thumb without damage to nail, initial encounter: Secondary | ICD-10-CM | POA: Diagnosis not present

## 2018-01-31 DIAGNOSIS — E569 Vitamin deficiency, unspecified: Secondary | ICD-10-CM | POA: Diagnosis not present

## 2018-01-31 DIAGNOSIS — Z01818 Encounter for other preprocedural examination: Secondary | ICD-10-CM | POA: Diagnosis not present

## 2018-02-28 DIAGNOSIS — Z6841 Body Mass Index (BMI) 40.0 and over, adult: Secondary | ICD-10-CM | POA: Diagnosis not present

## 2018-02-28 DIAGNOSIS — K5901 Slow transit constipation: Secondary | ICD-10-CM | POA: Diagnosis not present

## 2018-02-28 DIAGNOSIS — F330 Major depressive disorder, recurrent, mild: Secondary | ICD-10-CM | POA: Diagnosis not present

## 2018-02-28 DIAGNOSIS — R5383 Other fatigue: Secondary | ICD-10-CM | POA: Diagnosis not present

## 2018-03-27 DIAGNOSIS — F331 Major depressive disorder, recurrent, moderate: Secondary | ICD-10-CM | POA: Diagnosis not present

## 2018-03-27 DIAGNOSIS — K529 Noninfective gastroenteritis and colitis, unspecified: Secondary | ICD-10-CM | POA: Diagnosis not present

## 2018-03-27 DIAGNOSIS — R112 Nausea with vomiting, unspecified: Secondary | ICD-10-CM | POA: Diagnosis not present

## 2018-03-27 DIAGNOSIS — Z6841 Body Mass Index (BMI) 40.0 and over, adult: Secondary | ICD-10-CM | POA: Diagnosis not present

## 2018-05-21 DIAGNOSIS — R110 Nausea: Secondary | ICD-10-CM | POA: Diagnosis not present

## 2018-05-21 DIAGNOSIS — F339 Major depressive disorder, recurrent, unspecified: Secondary | ICD-10-CM | POA: Diagnosis not present

## 2018-05-21 DIAGNOSIS — R51 Headache: Secondary | ICD-10-CM | POA: Diagnosis not present

## 2018-05-21 DIAGNOSIS — R5383 Other fatigue: Secondary | ICD-10-CM | POA: Diagnosis not present

## 2018-05-21 DIAGNOSIS — R42 Dizziness and giddiness: Secondary | ICD-10-CM | POA: Diagnosis not present

## 2018-05-21 DIAGNOSIS — Z3202 Encounter for pregnancy test, result negative: Secondary | ICD-10-CM | POA: Diagnosis not present

## 2018-05-21 DIAGNOSIS — Z6841 Body Mass Index (BMI) 40.0 and over, adult: Secondary | ICD-10-CM | POA: Diagnosis not present

## 2018-05-21 DIAGNOSIS — R030 Elevated blood-pressure reading, without diagnosis of hypertension: Secondary | ICD-10-CM | POA: Diagnosis not present

## 2018-06-30 DIAGNOSIS — J449 Chronic obstructive pulmonary disease, unspecified: Secondary | ICD-10-CM | POA: Diagnosis not present

## 2018-06-30 DIAGNOSIS — R739 Hyperglycemia, unspecified: Secondary | ICD-10-CM | POA: Diagnosis not present

## 2018-06-30 DIAGNOSIS — J209 Acute bronchitis, unspecified: Secondary | ICD-10-CM | POA: Diagnosis not present

## 2018-06-30 DIAGNOSIS — E538 Deficiency of other specified B group vitamins: Secondary | ICD-10-CM | POA: Diagnosis not present

## 2018-06-30 DIAGNOSIS — R072 Precordial pain: Secondary | ICD-10-CM | POA: Diagnosis not present

## 2018-06-30 DIAGNOSIS — E559 Vitamin D deficiency, unspecified: Secondary | ICD-10-CM | POA: Diagnosis not present

## 2018-06-30 DIAGNOSIS — R42 Dizziness and giddiness: Secondary | ICD-10-CM | POA: Diagnosis not present

## 2018-06-30 DIAGNOSIS — J9801 Acute bronchospasm: Secondary | ICD-10-CM | POA: Diagnosis not present

## 2018-06-30 DIAGNOSIS — E7800 Pure hypercholesterolemia, unspecified: Secondary | ICD-10-CM | POA: Diagnosis not present

## 2018-06-30 DIAGNOSIS — Z6841 Body Mass Index (BMI) 40.0 and over, adult: Secondary | ICD-10-CM | POA: Diagnosis not present

## 2018-10-15 DIAGNOSIS — R7303 Prediabetes: Secondary | ICD-10-CM | POA: Diagnosis not present

## 2018-10-15 DIAGNOSIS — F418 Other specified anxiety disorders: Secondary | ICD-10-CM | POA: Diagnosis not present

## 2018-10-15 DIAGNOSIS — E782 Mixed hyperlipidemia: Secondary | ICD-10-CM | POA: Diagnosis not present

## 2018-10-15 DIAGNOSIS — E559 Vitamin D deficiency, unspecified: Secondary | ICD-10-CM | POA: Diagnosis not present

## 2018-12-03 DIAGNOSIS — Z6839 Body mass index (BMI) 39.0-39.9, adult: Secondary | ICD-10-CM | POA: Diagnosis not present

## 2018-12-03 DIAGNOSIS — J029 Acute pharyngitis, unspecified: Secondary | ICD-10-CM | POA: Diagnosis not present

## 2019-01-17 DIAGNOSIS — F418 Other specified anxiety disorders: Secondary | ICD-10-CM | POA: Diagnosis not present

## 2019-01-17 DIAGNOSIS — R7303 Prediabetes: Secondary | ICD-10-CM | POA: Diagnosis not present

## 2019-01-17 DIAGNOSIS — E782 Mixed hyperlipidemia: Secondary | ICD-10-CM | POA: Diagnosis not present

## 2019-01-17 DIAGNOSIS — E559 Vitamin D deficiency, unspecified: Secondary | ICD-10-CM | POA: Diagnosis not present

## 2019-04-08 DIAGNOSIS — Z20828 Contact with and (suspected) exposure to other viral communicable diseases: Secondary | ICD-10-CM | POA: Diagnosis not present

## 2019-04-08 DIAGNOSIS — Z03818 Encounter for observation for suspected exposure to other biological agents ruled out: Secondary | ICD-10-CM | POA: Diagnosis not present

## 2019-04-15 DIAGNOSIS — Z20828 Contact with and (suspected) exposure to other viral communicable diseases: Secondary | ICD-10-CM | POA: Diagnosis not present

## 2019-07-05 DIAGNOSIS — J029 Acute pharyngitis, unspecified: Secondary | ICD-10-CM | POA: Diagnosis not present

## 2019-07-05 DIAGNOSIS — R05 Cough: Secondary | ICD-10-CM | POA: Diagnosis not present

## 2019-07-05 DIAGNOSIS — R062 Wheezing: Secondary | ICD-10-CM | POA: Diagnosis not present

## 2019-07-05 DIAGNOSIS — Z20822 Contact with and (suspected) exposure to covid-19: Secondary | ICD-10-CM | POA: Diagnosis not present

## 2019-07-05 DIAGNOSIS — U071 COVID-19: Secondary | ICD-10-CM | POA: Diagnosis not present

## 2019-07-05 DIAGNOSIS — F418 Other specified anxiety disorders: Secondary | ICD-10-CM | POA: Diagnosis not present

## 2019-07-12 DIAGNOSIS — U071 COVID-19: Secondary | ICD-10-CM | POA: Diagnosis not present

## 2019-08-30 DIAGNOSIS — E782 Mixed hyperlipidemia: Secondary | ICD-10-CM | POA: Diagnosis not present

## 2019-08-30 DIAGNOSIS — R7303 Prediabetes: Secondary | ICD-10-CM | POA: Diagnosis not present

## 2019-08-30 DIAGNOSIS — E559 Vitamin D deficiency, unspecified: Secondary | ICD-10-CM | POA: Diagnosis not present

## 2019-08-30 DIAGNOSIS — F418 Other specified anxiety disorders: Secondary | ICD-10-CM | POA: Diagnosis not present

## 2019-09-30 DIAGNOSIS — L700 Acne vulgaris: Secondary | ICD-10-CM | POA: Diagnosis not present

## 2019-10-09 DIAGNOSIS — Z01118 Encounter for examination of ears and hearing with other abnormal findings: Secondary | ICD-10-CM | POA: Diagnosis not present

## 2019-10-09 DIAGNOSIS — Z1389 Encounter for screening for other disorder: Secondary | ICD-10-CM | POA: Diagnosis not present

## 2019-10-09 DIAGNOSIS — Z131 Encounter for screening for diabetes mellitus: Secondary | ICD-10-CM | POA: Diagnosis not present

## 2019-10-09 DIAGNOSIS — F418 Other specified anxiety disorders: Secondary | ICD-10-CM | POA: Diagnosis not present

## 2019-10-09 DIAGNOSIS — E782 Mixed hyperlipidemia: Secondary | ICD-10-CM | POA: Diagnosis not present

## 2019-10-09 DIAGNOSIS — Z1329 Encounter for screening for other suspected endocrine disorder: Secondary | ICD-10-CM | POA: Diagnosis not present

## 2019-10-09 DIAGNOSIS — Z0001 Encounter for general adult medical examination with abnormal findings: Secondary | ICD-10-CM | POA: Diagnosis not present

## 2019-10-09 DIAGNOSIS — Z136 Encounter for screening for cardiovascular disorders: Secondary | ICD-10-CM | POA: Diagnosis not present

## 2019-10-09 DIAGNOSIS — E559 Vitamin D deficiency, unspecified: Secondary | ICD-10-CM | POA: Diagnosis not present

## 2019-10-09 DIAGNOSIS — R7303 Prediabetes: Secondary | ICD-10-CM | POA: Diagnosis not present

## 2019-12-03 DIAGNOSIS — E6609 Other obesity due to excess calories: Secondary | ICD-10-CM | POA: Diagnosis not present

## 2019-12-03 DIAGNOSIS — R1032 Left lower quadrant pain: Secondary | ICD-10-CM | POA: Diagnosis not present

## 2019-12-03 DIAGNOSIS — M549 Dorsalgia, unspecified: Secondary | ICD-10-CM | POA: Diagnosis not present

## 2019-12-03 DIAGNOSIS — M545 Low back pain: Secondary | ICD-10-CM | POA: Diagnosis not present

## 2019-12-03 DIAGNOSIS — R319 Hematuria, unspecified: Secondary | ICD-10-CM | POA: Diagnosis not present

## 2019-12-06 DIAGNOSIS — S39012A Strain of muscle, fascia and tendon of lower back, initial encounter: Secondary | ICD-10-CM | POA: Diagnosis not present

## 2019-12-06 DIAGNOSIS — Y990 Civilian activity done for income or pay: Secondary | ICD-10-CM | POA: Diagnosis not present

## 2019-12-06 DIAGNOSIS — W240XXA Contact with lifting devices, not elsewhere classified, initial encounter: Secondary | ICD-10-CM | POA: Diagnosis not present

## 2020-03-02 DIAGNOSIS — E063 Autoimmune thyroiditis: Secondary | ICD-10-CM | POA: Diagnosis not present

## 2020-03-02 DIAGNOSIS — E559 Vitamin D deficiency, unspecified: Secondary | ICD-10-CM | POA: Diagnosis not present

## 2020-03-02 DIAGNOSIS — E0590 Thyrotoxicosis, unspecified without thyrotoxic crisis or storm: Secondary | ICD-10-CM | POA: Diagnosis not present

## 2020-03-02 DIAGNOSIS — Z6841 Body Mass Index (BMI) 40.0 and over, adult: Secondary | ICD-10-CM | POA: Diagnosis not present

## 2020-03-19 DIAGNOSIS — E0590 Thyrotoxicosis, unspecified without thyrotoxic crisis or storm: Secondary | ICD-10-CM | POA: Diagnosis not present

## 2020-03-19 DIAGNOSIS — R0683 Snoring: Secondary | ICD-10-CM | POA: Diagnosis not present

## 2020-03-19 DIAGNOSIS — E559 Vitamin D deficiency, unspecified: Secondary | ICD-10-CM | POA: Diagnosis not present

## 2020-03-19 DIAGNOSIS — E063 Autoimmune thyroiditis: Secondary | ICD-10-CM | POA: Diagnosis not present

## 2020-03-27 DIAGNOSIS — Z20822 Contact with and (suspected) exposure to covid-19: Secondary | ICD-10-CM | POA: Diagnosis not present

## 2020-03-27 DIAGNOSIS — Z20828 Contact with and (suspected) exposure to other viral communicable diseases: Secondary | ICD-10-CM | POA: Diagnosis not present

## 2020-04-01 DIAGNOSIS — Z01818 Encounter for other preprocedural examination: Secondary | ICD-10-CM | POA: Diagnosis not present

## 2020-04-01 DIAGNOSIS — Z6841 Body Mass Index (BMI) 40.0 and over, adult: Secondary | ICD-10-CM | POA: Diagnosis not present

## 2020-04-01 DIAGNOSIS — K219 Gastro-esophageal reflux disease without esophagitis: Secondary | ICD-10-CM | POA: Diagnosis not present

## 2020-04-03 DIAGNOSIS — Z01818 Encounter for other preprocedural examination: Secondary | ICD-10-CM | POA: Diagnosis not present

## 2020-04-13 DIAGNOSIS — E559 Vitamin D deficiency, unspecified: Secondary | ICD-10-CM | POA: Diagnosis not present

## 2020-04-28 DIAGNOSIS — J029 Acute pharyngitis, unspecified: Secondary | ICD-10-CM | POA: Diagnosis not present

## 2020-04-28 DIAGNOSIS — Z20822 Contact with and (suspected) exposure to covid-19: Secondary | ICD-10-CM | POA: Diagnosis not present

## 2020-04-29 DIAGNOSIS — R059 Cough, unspecified: Secondary | ICD-10-CM | POA: Diagnosis not present

## 2020-04-29 DIAGNOSIS — F418 Other specified anxiety disorders: Secondary | ICD-10-CM | POA: Diagnosis not present

## 2020-04-29 DIAGNOSIS — E559 Vitamin D deficiency, unspecified: Secondary | ICD-10-CM | POA: Diagnosis not present

## 2020-04-29 DIAGNOSIS — E782 Mixed hyperlipidemia: Secondary | ICD-10-CM | POA: Diagnosis not present

## 2020-04-30 DIAGNOSIS — Z1152 Encounter for screening for COVID-19: Secondary | ICD-10-CM | POA: Diagnosis not present

## 2020-04-30 DIAGNOSIS — R059 Cough, unspecified: Secondary | ICD-10-CM | POA: Diagnosis not present

## 2020-04-30 DIAGNOSIS — J069 Acute upper respiratory infection, unspecified: Secondary | ICD-10-CM | POA: Diagnosis not present

## 2020-05-04 DIAGNOSIS — Z0000 Encounter for general adult medical examination without abnormal findings: Secondary | ICD-10-CM | POA: Diagnosis not present

## 2020-05-04 DIAGNOSIS — R059 Cough, unspecified: Secondary | ICD-10-CM | POA: Diagnosis not present

## 2020-05-04 DIAGNOSIS — U071 COVID-19: Secondary | ICD-10-CM | POA: Diagnosis not present

## 2020-05-04 DIAGNOSIS — J45909 Unspecified asthma, uncomplicated: Secondary | ICD-10-CM | POA: Diagnosis not present

## 2020-05-04 DIAGNOSIS — R9389 Abnormal findings on diagnostic imaging of other specified body structures: Secondary | ICD-10-CM | POA: Diagnosis not present

## 2020-05-04 DIAGNOSIS — J209 Acute bronchitis, unspecified: Secondary | ICD-10-CM | POA: Diagnosis not present

## 2020-05-18 DIAGNOSIS — E0590 Thyrotoxicosis, unspecified without thyrotoxic crisis or storm: Secondary | ICD-10-CM | POA: Diagnosis not present

## 2020-05-18 DIAGNOSIS — E559 Vitamin D deficiency, unspecified: Secondary | ICD-10-CM | POA: Diagnosis not present

## 2020-05-18 DIAGNOSIS — E063 Autoimmune thyroiditis: Secondary | ICD-10-CM | POA: Diagnosis not present

## 2020-05-18 DIAGNOSIS — Z01818 Encounter for other preprocedural examination: Secondary | ICD-10-CM | POA: Diagnosis not present

## 2020-05-19 DIAGNOSIS — Z72821 Inadequate sleep hygiene: Secondary | ICD-10-CM | POA: Diagnosis not present

## 2020-05-19 DIAGNOSIS — G4710 Hypersomnia, unspecified: Secondary | ICD-10-CM | POA: Diagnosis not present

## 2020-06-05 DIAGNOSIS — Z8639 Personal history of other endocrine, nutritional and metabolic disease: Secondary | ICD-10-CM | POA: Diagnosis not present

## 2020-06-05 DIAGNOSIS — S01512A Laceration without foreign body of oral cavity, initial encounter: Secondary | ICD-10-CM | POA: Diagnosis not present

## 2020-06-05 DIAGNOSIS — Z23 Encounter for immunization: Secondary | ICD-10-CM | POA: Diagnosis not present

## 2020-06-05 DIAGNOSIS — Y9389 Activity, other specified: Secondary | ICD-10-CM | POA: Diagnosis not present

## 2020-06-05 DIAGNOSIS — Z8719 Personal history of other diseases of the digestive system: Secondary | ICD-10-CM | POA: Diagnosis not present

## 2020-06-05 DIAGNOSIS — S01419A Laceration without foreign body of unspecified cheek and temporomandibular area, initial encounter: Secondary | ICD-10-CM | POA: Diagnosis not present

## 2020-06-05 DIAGNOSIS — Y999 Unspecified external cause status: Secondary | ICD-10-CM | POA: Diagnosis not present

## 2020-06-11 DIAGNOSIS — K1379 Other lesions of oral mucosa: Secondary | ICD-10-CM | POA: Diagnosis not present

## 2020-06-11 DIAGNOSIS — R638 Other symptoms and signs concerning food and fluid intake: Secondary | ICD-10-CM | POA: Diagnosis not present

## 2020-06-15 DIAGNOSIS — Z6841 Body Mass Index (BMI) 40.0 and over, adult: Secondary | ICD-10-CM | POA: Diagnosis not present

## 2020-06-15 DIAGNOSIS — E559 Vitamin D deficiency, unspecified: Secondary | ICD-10-CM | POA: Diagnosis not present

## 2020-06-24 DIAGNOSIS — Z0000 Encounter for general adult medical examination without abnormal findings: Secondary | ICD-10-CM | POA: Diagnosis not present

## 2020-06-24 DIAGNOSIS — M542 Cervicalgia: Secondary | ICD-10-CM | POA: Diagnosis not present

## 2020-06-24 DIAGNOSIS — M62838 Other muscle spasm: Secondary | ICD-10-CM | POA: Diagnosis not present

## 2020-06-24 DIAGNOSIS — S01512D Laceration without foreign body of oral cavity, subsequent encounter: Secondary | ICD-10-CM | POA: Diagnosis not present

## 2020-06-29 DIAGNOSIS — Z7189 Other specified counseling: Secondary | ICD-10-CM | POA: Diagnosis not present

## 2020-06-29 DIAGNOSIS — Z6841 Body Mass Index (BMI) 40.0 and over, adult: Secondary | ICD-10-CM | POA: Diagnosis not present

## 2020-07-15 DIAGNOSIS — Z7189 Other specified counseling: Secondary | ICD-10-CM | POA: Diagnosis not present

## 2020-08-03 DIAGNOSIS — Z6841 Body Mass Index (BMI) 40.0 and over, adult: Secondary | ICD-10-CM | POA: Diagnosis not present

## 2020-09-14 DIAGNOSIS — E559 Vitamin D deficiency, unspecified: Secondary | ICD-10-CM | POA: Diagnosis not present

## 2020-09-29 DIAGNOSIS — E559 Vitamin D deficiency, unspecified: Secondary | ICD-10-CM | POA: Diagnosis not present

## 2020-09-29 DIAGNOSIS — Z6841 Body Mass Index (BMI) 40.0 and over, adult: Secondary | ICD-10-CM | POA: Diagnosis not present

## 2020-09-29 DIAGNOSIS — E063 Autoimmune thyroiditis: Secondary | ICD-10-CM | POA: Diagnosis not present

## 2020-09-29 DIAGNOSIS — K219 Gastro-esophageal reflux disease without esophagitis: Secondary | ICD-10-CM | POA: Diagnosis not present

## 2020-09-29 DIAGNOSIS — E0590 Thyrotoxicosis, unspecified without thyrotoxic crisis or storm: Secondary | ICD-10-CM | POA: Diagnosis not present

## 2020-10-01 DIAGNOSIS — K2950 Unspecified chronic gastritis without bleeding: Secondary | ICD-10-CM | POA: Diagnosis not present

## 2020-10-12 DIAGNOSIS — R1031 Right lower quadrant pain: Secondary | ICD-10-CM | POA: Diagnosis not present

## 2020-10-12 DIAGNOSIS — R1013 Epigastric pain: Secondary | ICD-10-CM | POA: Diagnosis not present

## 2020-10-12 DIAGNOSIS — Z3202 Encounter for pregnancy test, result negative: Secondary | ICD-10-CM | POA: Diagnosis not present

## 2020-10-12 DIAGNOSIS — K5900 Constipation, unspecified: Secondary | ICD-10-CM | POA: Diagnosis not present

## 2020-10-12 DIAGNOSIS — E860 Dehydration: Secondary | ICD-10-CM | POA: Diagnosis not present

## 2020-10-12 DIAGNOSIS — Z0000 Encounter for general adult medical examination without abnormal findings: Secondary | ICD-10-CM | POA: Diagnosis not present

## 2020-10-12 DIAGNOSIS — R110 Nausea: Secondary | ICD-10-CM | POA: Diagnosis not present

## 2020-11-04 DIAGNOSIS — L739 Follicular disorder, unspecified: Secondary | ICD-10-CM | POA: Diagnosis not present

## 2020-11-17 DIAGNOSIS — K5900 Constipation, unspecified: Secondary | ICD-10-CM | POA: Diagnosis not present

## 2020-11-17 DIAGNOSIS — F330 Major depressive disorder, recurrent, mild: Secondary | ICD-10-CM | POA: Diagnosis not present

## 2020-11-17 DIAGNOSIS — R41840 Attention and concentration deficit: Secondary | ICD-10-CM | POA: Diagnosis not present

## 2020-11-17 DIAGNOSIS — F331 Major depressive disorder, recurrent, moderate: Secondary | ICD-10-CM | POA: Diagnosis not present

## 2020-11-17 DIAGNOSIS — Z62898 Other specified problems related to upbringing: Secondary | ICD-10-CM | POA: Diagnosis not present

## 2020-11-17 DIAGNOSIS — E032 Hypothyroidism due to medicaments and other exogenous substances: Secondary | ICD-10-CM | POA: Diagnosis not present

## 2020-11-17 DIAGNOSIS — F411 Generalized anxiety disorder: Secondary | ICD-10-CM | POA: Diagnosis not present

## 2020-11-23 DIAGNOSIS — Z20822 Contact with and (suspected) exposure to covid-19: Secondary | ICD-10-CM | POA: Diagnosis not present

## 2020-11-23 DIAGNOSIS — J0100 Acute maxillary sinusitis, unspecified: Secondary | ICD-10-CM | POA: Diagnosis not present

## 2020-11-23 DIAGNOSIS — R051 Acute cough: Secondary | ICD-10-CM | POA: Diagnosis not present

## 2020-11-23 DIAGNOSIS — Z03818 Encounter for observation for suspected exposure to other biological agents ruled out: Secondary | ICD-10-CM | POA: Diagnosis not present

## 2020-11-23 DIAGNOSIS — J029 Acute pharyngitis, unspecified: Secondary | ICD-10-CM | POA: Diagnosis not present

## 2020-12-14 DIAGNOSIS — Z62898 Other specified problems related to upbringing: Secondary | ICD-10-CM | POA: Diagnosis not present

## 2020-12-14 DIAGNOSIS — F330 Major depressive disorder, recurrent, mild: Secondary | ICD-10-CM | POA: Diagnosis not present

## 2020-12-14 DIAGNOSIS — F411 Generalized anxiety disorder: Secondary | ICD-10-CM | POA: Diagnosis not present

## 2021-01-05 DIAGNOSIS — F330 Major depressive disorder, recurrent, mild: Secondary | ICD-10-CM | POA: Diagnosis not present

## 2021-01-05 DIAGNOSIS — F411 Generalized anxiety disorder: Secondary | ICD-10-CM | POA: Diagnosis not present

## 2021-01-05 DIAGNOSIS — Z62898 Other specified problems related to upbringing: Secondary | ICD-10-CM | POA: Diagnosis not present

## 2021-01-11 DIAGNOSIS — F331 Major depressive disorder, recurrent, moderate: Secondary | ICD-10-CM | POA: Diagnosis not present

## 2021-01-11 DIAGNOSIS — F411 Generalized anxiety disorder: Secondary | ICD-10-CM | POA: Diagnosis not present

## 2021-02-01 DIAGNOSIS — F411 Generalized anxiety disorder: Secondary | ICD-10-CM | POA: Diagnosis not present

## 2021-02-01 DIAGNOSIS — F331 Major depressive disorder, recurrent, moderate: Secondary | ICD-10-CM | POA: Diagnosis not present

## 2021-02-02 DIAGNOSIS — Z62898 Other specified problems related to upbringing: Secondary | ICD-10-CM | POA: Diagnosis not present

## 2021-02-02 DIAGNOSIS — F3342 Major depressive disorder, recurrent, in full remission: Secondary | ICD-10-CM | POA: Diagnosis not present

## 2021-02-02 DIAGNOSIS — F411 Generalized anxiety disorder: Secondary | ICD-10-CM | POA: Diagnosis not present

## 2021-02-05 DIAGNOSIS — Z0000 Encounter for general adult medical examination without abnormal findings: Secondary | ICD-10-CM | POA: Diagnosis not present

## 2021-02-05 DIAGNOSIS — M62830 Muscle spasm of back: Secondary | ICD-10-CM | POA: Diagnosis not present

## 2021-02-05 DIAGNOSIS — Z3202 Encounter for pregnancy test, result negative: Secondary | ICD-10-CM | POA: Diagnosis not present

## 2021-02-05 DIAGNOSIS — M545 Low back pain, unspecified: Secondary | ICD-10-CM | POA: Diagnosis not present

## 2021-02-10 DIAGNOSIS — Z62898 Other specified problems related to upbringing: Secondary | ICD-10-CM | POA: Diagnosis not present

## 2021-02-10 DIAGNOSIS — F3342 Major depressive disorder, recurrent, in full remission: Secondary | ICD-10-CM | POA: Diagnosis not present

## 2021-02-10 DIAGNOSIS — F411 Generalized anxiety disorder: Secondary | ICD-10-CM | POA: Diagnosis not present

## 2021-02-17 DIAGNOSIS — F3342 Major depressive disorder, recurrent, in full remission: Secondary | ICD-10-CM | POA: Diagnosis not present

## 2021-02-17 DIAGNOSIS — F411 Generalized anxiety disorder: Secondary | ICD-10-CM | POA: Diagnosis not present

## 2021-02-17 DIAGNOSIS — Z62898 Other specified problems related to upbringing: Secondary | ICD-10-CM | POA: Diagnosis not present

## 2021-02-22 DIAGNOSIS — F411 Generalized anxiety disorder: Secondary | ICD-10-CM | POA: Diagnosis not present

## 2021-02-22 DIAGNOSIS — F330 Major depressive disorder, recurrent, mild: Secondary | ICD-10-CM | POA: Diagnosis not present

## 2021-02-26 DIAGNOSIS — F418 Other specified anxiety disorders: Secondary | ICD-10-CM | POA: Diagnosis not present

## 2021-02-26 DIAGNOSIS — E559 Vitamin D deficiency, unspecified: Secondary | ICD-10-CM | POA: Diagnosis not present

## 2021-02-26 DIAGNOSIS — Z1329 Encounter for screening for other suspected endocrine disorder: Secondary | ICD-10-CM | POA: Diagnosis not present

## 2021-02-26 DIAGNOSIS — Z0001 Encounter for general adult medical examination with abnormal findings: Secondary | ICD-10-CM | POA: Diagnosis not present

## 2021-02-26 DIAGNOSIS — R7303 Prediabetes: Secondary | ICD-10-CM | POA: Diagnosis not present

## 2021-02-26 DIAGNOSIS — E782 Mixed hyperlipidemia: Secondary | ICD-10-CM | POA: Diagnosis not present

## 2021-02-26 DIAGNOSIS — Z131 Encounter for screening for diabetes mellitus: Secondary | ICD-10-CM | POA: Diagnosis not present

## 2021-02-26 DIAGNOSIS — Z136 Encounter for screening for cardiovascular disorders: Secondary | ICD-10-CM | POA: Diagnosis not present

## 2021-04-20 DIAGNOSIS — Z62898 Other specified problems related to upbringing: Secondary | ICD-10-CM | POA: Diagnosis not present

## 2021-04-20 DIAGNOSIS — F411 Generalized anxiety disorder: Secondary | ICD-10-CM | POA: Diagnosis not present

## 2021-04-20 DIAGNOSIS — F3342 Major depressive disorder, recurrent, in full remission: Secondary | ICD-10-CM | POA: Diagnosis not present

## 2021-04-29 DIAGNOSIS — R0981 Nasal congestion: Secondary | ICD-10-CM | POA: Diagnosis not present

## 2021-04-29 DIAGNOSIS — R059 Cough, unspecified: Secondary | ICD-10-CM | POA: Diagnosis not present

## 2021-04-29 DIAGNOSIS — J069 Acute upper respiratory infection, unspecified: Secondary | ICD-10-CM | POA: Diagnosis not present

## 2021-05-04 DIAGNOSIS — F411 Generalized anxiety disorder: Secondary | ICD-10-CM | POA: Diagnosis not present

## 2021-05-04 DIAGNOSIS — Z62898 Other specified problems related to upbringing: Secondary | ICD-10-CM | POA: Diagnosis not present

## 2021-05-04 DIAGNOSIS — F3342 Major depressive disorder, recurrent, in full remission: Secondary | ICD-10-CM | POA: Diagnosis not present

## 2021-05-20 DIAGNOSIS — Z62898 Other specified problems related to upbringing: Secondary | ICD-10-CM | POA: Diagnosis not present

## 2021-05-20 DIAGNOSIS — F411 Generalized anxiety disorder: Secondary | ICD-10-CM | POA: Diagnosis not present

## 2021-05-20 DIAGNOSIS — F3342 Major depressive disorder, recurrent, in full remission: Secondary | ICD-10-CM | POA: Diagnosis not present

## 2021-05-24 DIAGNOSIS — F325 Major depressive disorder, single episode, in full remission: Secondary | ICD-10-CM | POA: Diagnosis not present

## 2021-05-24 DIAGNOSIS — F411 Generalized anxiety disorder: Secondary | ICD-10-CM | POA: Diagnosis not present

## 2021-06-02 DIAGNOSIS — F3342 Major depressive disorder, recurrent, in full remission: Secondary | ICD-10-CM | POA: Diagnosis not present

## 2021-06-02 DIAGNOSIS — Z62898 Other specified problems related to upbringing: Secondary | ICD-10-CM | POA: Diagnosis not present

## 2021-06-02 DIAGNOSIS — F411 Generalized anxiety disorder: Secondary | ICD-10-CM | POA: Diagnosis not present

## 2021-07-07 DIAGNOSIS — F3342 Major depressive disorder, recurrent, in full remission: Secondary | ICD-10-CM | POA: Diagnosis not present

## 2021-07-07 DIAGNOSIS — F411 Generalized anxiety disorder: Secondary | ICD-10-CM | POA: Diagnosis not present

## 2021-07-07 DIAGNOSIS — Z62898 Other specified problems related to upbringing: Secondary | ICD-10-CM | POA: Diagnosis not present

## 2021-10-29 DIAGNOSIS — Z124 Encounter for screening for malignant neoplasm of cervix: Secondary | ICD-10-CM | POA: Diagnosis not present

## 2021-10-29 DIAGNOSIS — Z130 Encounter for screening for diseases of the blood and blood-forming organs and certain disorders involving the immune mechanism: Secondary | ICD-10-CM | POA: Diagnosis not present

## 2021-10-29 DIAGNOSIS — Z01419 Encounter for gynecological examination (general) (routine) without abnormal findings: Secondary | ICD-10-CM | POA: Diagnosis not present

## 2021-10-29 DIAGNOSIS — R87610 Atypical squamous cells of undetermined significance on cytologic smear of cervix (ASC-US): Secondary | ICD-10-CM | POA: Diagnosis not present

## 2021-12-06 DIAGNOSIS — N760 Acute vaginitis: Secondary | ICD-10-CM | POA: Diagnosis not present

## 2022-01-10 DIAGNOSIS — H9201 Otalgia, right ear: Secondary | ICD-10-CM | POA: Diagnosis not present

## 2022-01-23 DIAGNOSIS — H66009 Acute suppurative otitis media without spontaneous rupture of ear drum, unspecified ear: Secondary | ICD-10-CM | POA: Diagnosis not present

## 2022-05-10 DIAGNOSIS — J069 Acute upper respiratory infection, unspecified: Secondary | ICD-10-CM | POA: Diagnosis not present

## 2022-05-10 DIAGNOSIS — G501 Atypical facial pain: Secondary | ICD-10-CM | POA: Diagnosis not present

## 2022-05-10 DIAGNOSIS — B349 Viral infection, unspecified: Secondary | ICD-10-CM | POA: Diagnosis not present

## 2022-05-11 DIAGNOSIS — J9801 Acute bronchospasm: Secondary | ICD-10-CM | POA: Diagnosis not present

## 2022-05-11 DIAGNOSIS — J019 Acute sinusitis, unspecified: Secondary | ICD-10-CM | POA: Diagnosis not present

## 2022-05-28 DIAGNOSIS — S46811A Strain of other muscles, fascia and tendons at shoulder and upper arm level, right arm, initial encounter: Secondary | ICD-10-CM | POA: Diagnosis not present

## 2022-05-28 DIAGNOSIS — X509XXA Other and unspecified overexertion or strenuous movements or postures, initial encounter: Secondary | ICD-10-CM | POA: Diagnosis not present

## 2022-07-03 DIAGNOSIS — F411 Generalized anxiety disorder: Secondary | ICD-10-CM | POA: Diagnosis not present

## 2022-07-14 DIAGNOSIS — J069 Acute upper respiratory infection, unspecified: Secondary | ICD-10-CM | POA: Diagnosis not present

## 2022-09-08 DIAGNOSIS — F419 Anxiety disorder, unspecified: Secondary | ICD-10-CM | POA: Diagnosis not present

## 2022-09-08 DIAGNOSIS — F324 Major depressive disorder, single episode, in partial remission: Secondary | ICD-10-CM | POA: Diagnosis not present

## 2022-09-15 DIAGNOSIS — J019 Acute sinusitis, unspecified: Secondary | ICD-10-CM | POA: Diagnosis not present

## 2022-09-28 DIAGNOSIS — F324 Major depressive disorder, single episode, in partial remission: Secondary | ICD-10-CM | POA: Diagnosis not present

## 2022-09-28 DIAGNOSIS — F419 Anxiety disorder, unspecified: Secondary | ICD-10-CM | POA: Diagnosis not present

## 2022-11-16 DIAGNOSIS — F324 Major depressive disorder, single episode, in partial remission: Secondary | ICD-10-CM | POA: Diagnosis not present

## 2022-11-16 DIAGNOSIS — F419 Anxiety disorder, unspecified: Secondary | ICD-10-CM | POA: Diagnosis not present

## 2023-04-11 DIAGNOSIS — N761 Subacute and chronic vaginitis: Secondary | ICD-10-CM | POA: Diagnosis not present

## 2023-04-20 DIAGNOSIS — A499 Bacterial infection, unspecified: Secondary | ICD-10-CM | POA: Diagnosis not present

## 2023-12-01 ENCOUNTER — Encounter: Payer: Self-pay | Admitting: Advanced Practice Midwife

## 2024-03-21 ENCOUNTER — Other Ambulatory Visit: Payer: Self-pay

## 2024-03-21 ENCOUNTER — Ambulatory Visit
Admission: RE | Admit: 2024-03-21 | Discharge: 2024-03-21 | Disposition: A | Discharge status: Home / Self Care | Attending: Internal Medicine | Admitting: Internal Medicine

## 2024-03-21 VITALS — BP 118/82 | HR 76 | Temp 98.3°F | Resp 17 | Ht 66.0 in | Wt 198.0 lb

## 2024-03-21 DIAGNOSIS — L308 Other specified dermatitis: Secondary | ICD-10-CM | POA: Diagnosis not present

## 2024-03-21 NOTE — ED Triage Notes (Signed)
 Pt presents with a chief complaint of constant itching over generalized body x 2 weeks. Went to another UC facility last week, received a steroid injection and oral prednisone . Pt states the prednisone  has made her itching worse. Finished oral prednisone  two days ago. Reports she was clawing at her legs last night. Feels like her legs are starting to retain fluid. Currently rates her pain in legs a 4/10. No other medications taken PTA.

## 2024-03-21 NOTE — Discharge Instructions (Addendum)
 At home please use the following antihistamine regimen: Daily zyrtec (cetirizine) 10 mg Nighty pepcid  (famotidine ) 20 mg and benadryl  (diphenhydramine ) 25 mg Use for the next 7 days in a row to keep itch away Follow up at your primary care visit next week   I recommend [facility] for the cheapest medications! There is a QR code you can scan to see locations and medication prices.

## 2024-03-21 NOTE — ED Provider Notes (Signed)
 [facility]    CSN: [id number] Arrival date & time: 03/21/24  1320      History   Chief Complaint Chief Complaint  Patient presents with   Pruritis    HPI Caitlin Dodson is a 33 y.o. female.  Itching all over the body for 2 weeks, worse in the legs.  Last week went to a different urgent care, was given IM steroid and oral prednisone . She finished these but thinks it made her itching worse. Itching making her legs feel swollen as well  No other medications taken yet  History of hypothyroid, lupus Denies sun exposure, other new products She did get a heated blanket a few weeks ago and didn't wash it before use  Past Medical History:  Diagnosis Date   Hypothyroid    Lupus    Migraine    Persistent headaches    Seizures (HCC)     There are no active problems to display for this patient.   History reviewed. No pertinent surgical history.  OB History   No obstetric history on file.      Home Medications    Prior to Admission medications   Medication Sig Start Date End Date Taking? Authorizing Provider  diazepam  (VALIUM ) 5 MG tablet Take 1 tablet (5 mg total) by mouth every 8 (eight) hours as needed (for vertigo (room spinning)). 04/16/15   Molpus, John, MD  ondansetron  (ZOFRAN  ODT) 8 MG disintegrating tablet Take 1 tablet (8 mg total) by mouth every 8 (eight) hours as needed for nausea or vomiting. 04/16/15   Molpus, Norleen, MD    Family History History reviewed. No pertinent family history.  Social History Social History   Tobacco Use   Smoking status: Never   Smokeless tobacco: Never  Vaping Use   Vaping status: Never Used  Substance Use Topics   Alcohol use: No   Drug use: No     Allergies   Tomato   Review of Systems Review of Systems As per HPI   Physical Exam Triage Vital Signs ED Triage Vitals  Encounter Vitals Group     BP 03/21/24 1345 118/82     Girls Systolic BP Percentile --      Girls Diastolic BP Percentile --       Boys Systolic BP Percentile --      Boys Diastolic BP Percentile --      Pulse Rate 03/21/24 1345 76     Resp 03/21/24 1345 17     Temp 03/21/24 1345 98.3 F (36.8 C)     Temp Source 03/21/24 1345 Oral     SpO2 03/21/24 1345 98 %     Weight 03/21/24 1343 198 lb (89.8 kg)     Height 03/21/24 1343 5' 6 (1.676 m)     Head Circumference --      Peak Flow --      Pain Score 03/21/24 1343 4     Pain Loc --      Pain Education --      Exclude from Growth Chart --    No data found.  Updated Vital Signs BP 118/82 (BP Location: Right Arm)   Pulse 76   Temp 98.3 F (36.8 C) (Oral)   Resp 17   Ht 5' 6 (1.676 m)   Wt 198 lb (89.8 kg)   LMP 02/16/2024 (Exact Date)   SpO2 98%   BMI 31.96 kg/m   Visual Acuity Right Eye Distance:   Left Eye Distance:  Bilateral Distance:    Right Eye Near:   Left Eye Near:    Bilateral Near:     Physical Exam Vitals and nursing note reviewed.  Constitutional:      General: She is not in acute distress.    Appearance: Normal appearance.  HENT:     Mouth/Throat:     Mouth: Mucous membranes are moist.     Pharynx: Oropharynx is clear. No posterior oropharyngeal erythema.  Eyes:     Conjunctiva/sclera: Conjunctivae normal.     Pupils: Pupils are equal, round, and reactive to light.  Cardiovascular:     Rate and Rhythm: Normal rate and regular rhythm.     Pulses: Normal pulses.     Heart sounds: Normal heart sounds.  Pulmonary:     Effort: Pulmonary effort is normal. No respiratory distress.     Breath sounds: Normal breath sounds. No wheezing.  Abdominal:     General: Bowel sounds are normal.     Tenderness: There is no abdominal tenderness.  Musculoskeletal:        General: Normal range of motion.     Cervical back: Normal range of motion.     Right lower leg: No swelling or tenderness. No edema.     Left lower leg: No swelling or tenderness. No edema.  Skin:    General: Skin is warm and dry.     Findings: No rash.   Neurological:     Mental Status: She is alert and oriented to person, place, and time.      UC Treatments / Results  Labs (all labs ordered are listed, but only abnormal results are displayed) Labs Reviewed - No data to display  EKG   Radiology No results found.  Procedures Procedures (including critical care time)  Medications Ordered in UC Medications - No data to display  Initial Impression / Assessment and Plan / UC Course  I have reviewed the triage vital signs and the nursing notes.  Pertinent labs & imaging results that were available during my care of the patient were reviewed by me and considered in my medical decision making (see chart for details).  Pruritus without rash No oral swelling. Clear lungs. Stable vitals.  Not improved after IM steroid and oral prednisone  Recommend anti-histamine regimen. Daily zyrtec, nightly pepcid  and benadryl . Advised other supportive care and home treatments. She has PCP visit coming up next week, will follow up with them if symptoms are persisting. Patient is agreeable to this plan, no questions   Final Clinical Impressions(s) / UC Diagnoses   Final diagnoses:  Pruritic dermatitis     Discharge Instructions      At home please use the following antihistamine regimen: Daily zyrtec (cetirizine) 10 mg Nighty pepcid  (famotidine ) 20 mg and benadryl  (diphenhydramine ) 25 mg Use for the next 7 days in a row to keep itch away Follow up at your primary care visit next week   I recommend [facility] for the cheapest medications! There is a QR code you can scan to see locations and medication prices.      ED Prescriptions   None    PDMP not reviewed this encounter.   Jeryl Stabs, PA-C 03/21/24 1421
